# Patient Record
Sex: Female | Born: 1970
Health system: Southern US, Community
[De-identification: ages and names within clinical notes are randomized; demographics above are authoritative.]

## PROBLEM LIST (undated history)

## (undated) ENCOUNTER — Ambulatory Visit (HOSPITAL_COMMUNITY)

## (undated) DIAGNOSIS — D6859 Other primary thrombophilia: Secondary | ICD-10-CM

## (undated) DIAGNOSIS — Z86718 Personal history of other venous thrombosis and embolism: Secondary | ICD-10-CM

## (undated) DIAGNOSIS — I82409 Acute embolism and thrombosis of unspecified deep veins of unspecified lower extremity: Secondary | ICD-10-CM

## (undated) DIAGNOSIS — I10 Essential (primary) hypertension: Secondary | ICD-10-CM

## (undated) HISTORY — DX: Acute embolism and thrombosis of unspecified deep veins of unspecified lower extremity: I82.409

## (undated) HISTORY — DX: Other primary thrombophilia: D68.59

## (undated) HISTORY — DX: Personal history of other venous thrombosis and embolism: Z86.718

## (undated) HISTORY — PX: CHOLECYSTECTOMY: SHX55

## (undated) HISTORY — DX: Essential (primary) hypertension: I10

---

## 1997-11-15 ENCOUNTER — Encounter: Admission: RE | Admit: 1997-11-15 | Discharge: 1997-11-15 | Payer: Self-pay | Admitting: Family Medicine

## 1997-11-15 ENCOUNTER — Other Ambulatory Visit: Admission: RE | Admit: 1997-11-15 | Discharge: 1997-11-15 | Payer: Self-pay | Admitting: *Deleted

## 1999-02-01 ENCOUNTER — Encounter: Admission: RE | Admit: 1999-02-01 | Discharge: 1999-02-01 | Payer: Self-pay | Admitting: Family Medicine

## 1999-02-24 ENCOUNTER — Encounter: Admission: RE | Admit: 1999-02-24 | Discharge: 1999-02-24 | Payer: Self-pay | Admitting: Family Medicine

## 1999-06-23 ENCOUNTER — Other Ambulatory Visit: Admission: RE | Admit: 1999-06-23 | Discharge: 1999-06-23 | Payer: Self-pay | Admitting: Obstetrics and Gynecology

## 1999-11-24 ENCOUNTER — Ambulatory Visit (HOSPITAL_COMMUNITY): Admission: RE | Admit: 1999-11-24 | Discharge: 1999-11-24 | Payer: Self-pay | Admitting: Obstetrics and Gynecology

## 1999-11-24 ENCOUNTER — Other Ambulatory Visit: Admission: RE | Admit: 1999-11-24 | Discharge: 1999-11-24 | Payer: Self-pay | Admitting: Obstetrics and Gynecology

## 1999-12-01 ENCOUNTER — Encounter: Payer: Self-pay | Admitting: Obstetrics and Gynecology

## 1999-12-01 ENCOUNTER — Ambulatory Visit (HOSPITAL_COMMUNITY): Admission: RE | Admit: 1999-12-01 | Discharge: 1999-12-01 | Payer: Self-pay | Admitting: Obstetrics and Gynecology

## 2000-01-09 ENCOUNTER — Encounter: Admission: RE | Admit: 2000-01-09 | Discharge: 2000-04-08 | Payer: Self-pay | Admitting: Obstetrics and Gynecology

## 2000-02-17 ENCOUNTER — Inpatient Hospital Stay (HOSPITAL_COMMUNITY): Admission: AD | Admit: 2000-02-17 | Discharge: 2000-02-21 | Payer: Self-pay | Admitting: *Deleted

## 2000-04-04 ENCOUNTER — Other Ambulatory Visit: Admission: RE | Admit: 2000-04-04 | Discharge: 2000-04-04 | Payer: Self-pay | Admitting: Obstetrics and Gynecology

## 2000-06-04 ENCOUNTER — Ambulatory Visit (HOSPITAL_COMMUNITY): Admission: RE | Admit: 2000-06-04 | Discharge: 2000-06-04 | Payer: Self-pay | Admitting: *Deleted

## 2000-06-04 ENCOUNTER — Encounter (INDEPENDENT_AMBULATORY_CARE_PROVIDER_SITE_OTHER): Payer: Self-pay | Admitting: *Deleted

## 2000-06-04 ENCOUNTER — Encounter: Payer: Self-pay | Admitting: *Deleted

## 2000-06-04 ENCOUNTER — Ambulatory Visit (HOSPITAL_COMMUNITY): Admission: RE | Admit: 2000-06-04 | Discharge: 2000-06-05 | Payer: Self-pay | Admitting: General Surgery

## 2001-09-23 ENCOUNTER — Emergency Department (HOSPITAL_COMMUNITY): Admission: EM | Admit: 2001-09-23 | Discharge: 2001-09-23 | Payer: Self-pay | Admitting: Emergency Medicine

## 2003-03-27 HISTORY — PX: VEIN SURGERY: SHX48

## 2004-07-24 ENCOUNTER — Encounter: Admission: RE | Admit: 2004-07-24 | Discharge: 2004-07-24 | Payer: Self-pay | Admitting: Vascular Surgery

## 2010-06-12 ENCOUNTER — Other Ambulatory Visit: Payer: Self-pay

## 2010-06-12 ENCOUNTER — Other Ambulatory Visit: Payer: Self-pay | Admitting: Family Medicine

## 2010-08-11 NOTE — Op Note (Signed)
[facility]  Patient:    Cheryl Patel, Cheryl Patel                       MRN: [id number] Proc. Date: 02/18/00 Adm. Date:  [id number] Attending:  Marina Gravel B                           Operative Report  PREOPERATIVE DIAGNOSIS:       Breech presentation.  POSTOPERATIVE DIAGNOSIS:      Back down transverse lie.  OPERATION:                    Repeat low transverse cesarean section.  SURGEON:                      Marina Gravel, M.D.  ASSISTANT:                    Conni Elliot, M.D.  ANESTHESIA:                   Spinal.  FINDINGS:                     A viable female infant, back down transverse lie, Apgars 5 and 9.  Normal uterus, tubes, ovary.  COMPLICATIONS:                None.  DRAINS:                       Foley.  ESTIMATED BLOOD LOSS:         1000 cc.  INDICATIONS:                  This patient presented with spontaneous rupture of membranes at 37-5/7 weeks with a history of a previous C-section in [place].  She had subsequently had a successful VBAC in [place] and had been instructed that she was a VBAC candidate.  On admission, cervix was 2 cm dilated and vertex with a high presenting part.  Vertex presentation was confirmed by ultrasound.  Therefore, the patient was induced with Pitocin.  The patient progressed to 4 cm and small fetal parts were palpable at the cervical os.  A breech presentation was determined by ultrasound.  Therefore, we proceed with repeat low transverse cesarean section.  DESCRIPTION OF PROCEDURE:     The patient was taken to the operating room and spinal anesthesia obtained.  She was then prepped and draped in the standard fashion.  Foley catheter inserted into the bladder.  The knife was used to incise through the patients previous vertical midline skin incision.  This was carried sharply to the underlying fascia.  The fascia was divided sharply and elevated with the Kocher clamps.  The midline rectus muscles  were identified.  The underlying peritoneum was elevated with hemostats and entered sharply.  The incision was extended superiorly inferiorly sharply with good visualized of surrounding organs.  The bladder blade was inserted.  The vesicouterine peritoneum identified, elevated and bladder flap created with sharp and blunt technique.  Bladder blade was then reinserted and uterine incision made in a low transverse fashion with a knife and extended with bandage scissors.  Upon entry into the uterine cavity, a transverse lie was noted.  The spine was followed to the hips and lower extremity delivered through the incision by flexion of the hip and  traction on the foot.  The hips were then rotated sacrum anterior and the contralateral lower extremity delivered through the incision by flexion at the hips and knees.  Traction was then placed on the iliac crest to deliver the infant to the level of the scapulae.  The upper extremities were then delivered by flexion of the elbows.  Finally the head was delivered by flexion at the neck.  Nose and mouth suctioned with the bulb.  The cord was clamped and cut and the infant handed to the awaiting attendants.  The patient had been receiving penicillin for group B strep prophylaxis and therefore no additional antibiotics were given.  The placenta was removed manually and exteriorized and cleared of all clots and debris.  The uterine incision was then closed in two layers with 0 Monocryl.  A bleeder of the peritoneum on the left side was also reapproximated with 0 Vicryl.  Hemostasis was obtained.  Bladder flap hemostatic.  The uterus was reinserted into the abdomen and the pelvis irrigated.  The uterine incision was hemostatic.  The fascia was then closed with a running stitch of 0 PDS.  The subcutaneous tissue was irrigated and made hemostatic with a Bovie.  The skin was closed with staples.  All counts were correct per the operating room  staff.  The patient tolerated the procedure well and there were no complications. She was taken to the recovery room awake, alert and in stable condition.    DESCRIPTION OF PROCEDURE: DD:  02/18/00 TD:  02/18/00 Job: [id number]

## 2010-08-11 NOTE — Discharge Summary (Signed)
[facility]  Patient:    Cheryl Patel, Cheryl Patel                       MRN: [id number] Adm. Date:  [id number] Disc. Date: [id number] Attending:  Marina Gravel B                           Discharge Summary  ADMISSION DIAGNOSES:          Term intrauterine pregnancy, spontaneous rupture of membranes, vertex presentation.  DISCHARGE DIAGNOSES:          Term intrauterine pregnancy, delivered, spontaneous version to breech.  PROCEDURE:                    Repeat low transverse cesarean section.  HISTORY OF PRESENT ILLNESS:   For complete details please see the history and physical in the chart.  However, in brief, the patient is a 40 year old [place] female gravida 3, para 1-1-0-2 at 68 5/7 weeks who presented with spontaneous rupture of membranes on the day of admission.  She was having mild contractions, no bleeding.  Reported active fetus.  Prenatal care at [facility] with Dr. Estanislado Pandy uncomplicated except for late entry of prenatal care.                                Has a known history of previous cesarean section at 28 weeks for severe preeclampsia and cerebral bleed.  Successful VBAC followed that pregnancy at 40 weeks in [place].  She was told in [place] that it was okay for try for vaginal birth after cesarean.  Patient was admitted to labor and delivery for induction of labor with spontaneous rupture of membranes at term.  HOSPITAL COURSE:              On admission the cervix was 2 cm dilated, 50% effaced, high presenting station.  Vertex was confirmed at the bed side with ultrasound.  Patient was started on Pitocin given she was not having adequate contractions.  She progressed to 4 cm, complete effacement, high presenting part.  At that point small parts were palpable at the cervix.  Ultrasound was performed and confirmed would be a breech presentation.  Patient was therefore taken to the operating room for repeat low transverse cesarean section  for spontaneous version to breech.                                At the time of surgery patient was actually found to have a back down transverse lie and was delivered by repeat low transverse cesarean section.  A viable female infant Apgars 5 and 9 was delivered without difficulty.  Birth weight was 7 pounds 6 ounces.  There were no complications from the surgery.                                Postoperatively patient rapidly regained her ability to ambulate, void, and tolerate a regular diet.  She was discharged home on the third postoperative day in satisfactory condition.  Her postoperative hemoglobin was 11.4.  DISCHARGE MEDICATIONS:        Tylox one to two tablets q.4-6h. as needed for pain.  DISCHARGE INSTRUCTIONS:       Standard  preprinted discharge booklet given prior to dismissal.  FOLLOW-UP:                    Four to six weeks [facility]. DD:  03/14/00 TD:  03/14/00 Job: [id number]

## 2010-08-11 NOTE — Op Note (Signed)
[place]  Patient:    Cheryl Patel, Cheryl Patel                       MRN: [id number] Proc. Date: 06/04/00 Adm. Date:  [id number] Attending:  Glenna Fellows Tappan                           Operative Report  PREOPERATIVE DIAGNOSIS:  Chololithiasis and acute cholecystitis.  POSTOPERATIVE DIAGNOSIS:  Chololithiasis and acute cholecystitis.  PROCEDURE:  Laparoscopic cholecystectomy.  SURGEON:  Lorne Skeens. Hoxworth, M.D.  ASSISTANT:  Milus Mallick, M.D.  ANESTHESIA:  General.  BRIEF HISTORY:  Cheryl Patel is a 40 year old female who presents with 2-3 days of severe epigastric and right upper quadrant abdominal pain.  Ultrasound is showing a thickened gallbladder wall with stones.  She is markedly tender in the right upper quadrant.  White count is elevated to 17,000.  She is felt to have cholecystitis and chololithiasis, and laparoscopic cholecystectomy has been recommended and accepted.  The nature of procedures, indications, risks of bleeding, infection and bile duct injury have been discussed and understood.  She is now brought to the operating room for this procedure.  DESCRIPTION OF PROCEDURE:  The patient was brought to the operating room, placed in the supine position on the operating table and general endotracheal anesthesia was induced.  She received broad-spectrum antibiotic preoperatively.  The abdomen was sterilely prepped and draped.  PSA hose were in place.  Local anesthesia was used at the trocar sites prior to the incisions.  A 1 cm incision was made at the umbilicus and dissection carried down to the midline fascia which was sharply incised for 1 cm and the peritoneum entered under direct vision.  Through a mattress suture of 0 Vicryl the Hasson trocar was placed and pneumoperitoneum established.  Under direct vision a 10 mm trocar was placed in the subxiphoid area and two 5 mm trocars along the right subcostal margin.  The  gallbladder was visualized.  It was distended tensely and edematous.  It was decompressed with the needle aspirator.  The fundus was then grasped and elevated up over the liver. Omental adhesions were taken down bluntly with cautery and the infundibulum exposed and retracted inferolaterally.  The gallbladder wall was very edematous.  fibrofatty tissue was stripped off the neck of the gallbladder toward the porta hepatis.  The distal gallbladder was defined and seemed to tape down to the cystic duct.  The cystic duct was dissected over about 1 cm and the cystic duct gallbladder junction dissected 360 degrees.  Calots triangle was completely dissected and the cystic artery exposed coursing up along with the gallbladder wall.  When the anatomy was cleared the cystic duct was doubly clipped proximally, clipped distally and divided as was the cystic artery.  The gallbladder was then dissected free from its bed using hook cautery.  It was edematous but the dissection progressed nicely and the gallbladder was detached and placed in an endocatch bag and brought out through the umbilicus.  The right upper quadrant was irrigated and complete hemostasis assured in the gallbladder bed.  Trocars were removed under direct vision and all CO2 was evacuated from the peritoneal cavity.  The pursestring suture was secured at the umbilicus.  Skin incisions were closed with interrupted subcuticular 4-0 Monocryl and Steri-Strips.  Sponge, needle and instrument counts were correct.  Dry sterile dressing was applied and the patient was  taken to recovery in good condition. DD:  06/05/00 TD:  06/05/00 Job: [id number]

## 2012-03-24 ENCOUNTER — Other Ambulatory Visit (HOSPITAL_COMMUNITY): Payer: Self-pay | Admitting: Cardiovascular Disease

## 2012-03-24 DIAGNOSIS — R011 Cardiac murmur, unspecified: Secondary | ICD-10-CM

## 2012-03-24 DIAGNOSIS — I272 Pulmonary hypertension, unspecified: Secondary | ICD-10-CM

## 2012-03-31 ENCOUNTER — Ambulatory Visit (HOSPITAL_COMMUNITY)
Admission: RE | Admit: 2012-03-31 | Discharge: 2012-03-31 | Disposition: A | Discharge status: Home / Self Care | Payer: BC Managed Care – PPO | Source: Ambulatory Visit | Attending: Cardiovascular Disease | Admitting: Cardiovascular Disease

## 2012-03-31 DIAGNOSIS — I059 Rheumatic mitral valve disease, unspecified: Secondary | ICD-10-CM | POA: Insufficient documentation

## 2012-03-31 DIAGNOSIS — R079 Chest pain, unspecified: Secondary | ICD-10-CM | POA: Insufficient documentation

## 2012-03-31 DIAGNOSIS — I369 Nonrheumatic tricuspid valve disorder, unspecified: Secondary | ICD-10-CM | POA: Insufficient documentation

## 2012-03-31 DIAGNOSIS — I272 Pulmonary hypertension, unspecified: Secondary | ICD-10-CM

## 2012-03-31 DIAGNOSIS — R011 Cardiac murmur, unspecified: Secondary | ICD-10-CM | POA: Insufficient documentation

## 2012-03-31 HISTORY — PX: TRANSTHORACIC ECHOCARDIOGRAM: SHX275

## 2012-03-31 NOTE — Progress Notes (Signed)
2D Echo Performed 03/31/2012    Mackey Varricchio, RCS  

## 2013-01-21 ENCOUNTER — Encounter: Payer: Self-pay | Admitting: *Deleted

## 2013-01-23 ENCOUNTER — Ambulatory Visit: Payer: BC Managed Care – PPO | Admitting: Cardiovascular Disease

## 2013-01-27 ENCOUNTER — Encounter: Payer: Self-pay | Admitting: Cardiovascular Disease

## 2013-01-28 ENCOUNTER — Encounter: Payer: Self-pay | Admitting: Cardiovascular Disease

## 2013-01-28 ENCOUNTER — Ambulatory Visit (INDEPENDENT_AMBULATORY_CARE_PROVIDER_SITE_OTHER): Payer: BC Managed Care – PPO | Admitting: Cardiovascular Disease

## 2013-01-28 VITALS — BP 144/102 | HR 90 | Ht 68.0 in | Wt 192.2 lb

## 2013-01-28 DIAGNOSIS — I10 Essential (primary) hypertension: Secondary | ICD-10-CM

## 2013-01-28 DIAGNOSIS — Z86718 Personal history of other venous thrombosis and embolism: Secondary | ICD-10-CM | POA: Insufficient documentation

## 2013-01-28 MED ORDER — RIVAROXABAN 20 MG PO TABS: 20.0000 mg | ORAL_TABLET | Freq: Every day | ORAL | Status: DC

## 2013-01-28 NOTE — Progress Notes (Signed)
     01/28/2013 Cheryl Patel   [birth year]  [id number]  Primary Physician No primary provider on file. Primary Cardiologist: Runell Gess MD Roseanne Reno   HPI:  Cheryl Patel is a 42 year old moderately overweight married Caucasian female with mother 3 currently does not work. She is formally a patient of Dr. Alanda Amass There is a history of remote DVT on this alto. She has no other medical problems. She does have chronic left lower gemmae swelling and venous Dopplers performed at our office a year ago did not show evidence of DVT. She also has high blood pressure which she has documented on multiple occasions at home associated with headaches.   Current Outpatient Prescriptions  Medication Sig Dispense Refill  . Acetaminophen (TYLENOL PO) Take by mouth as needed.      . hydroxychloroquine (PLAQUENIL) 200 MG tablet Take 200 mg by mouth 2 (two) times daily.      . Rivaroxaban (XARELTO) 20 MG TABS tablet Take 20 mg by mouth daily.       No current facility-administered medications for this visit.    No Known Allergies  History   Social History  . Marital Status: Single    Spouse Name: N/A    Number of Children: 3  . Years of Education: N/A   Occupational History  . Not on file.   Social History Main Topics  . Smoking status: Never Smoker   . Smokeless tobacco: Never Used  . Alcohol Use: No  . Drug Use: No  . Sexual Activity: Not on file   Other Topics Concern  . Not on file   Social History Narrative   From [place]     Review of Systems: General: negative for chills, fever, night sweats or weight changes.  Cardiovascular: negative for chest pain, dyspnea on exertion, edema, orthopnea, palpitations, paroxysmal nocturnal dyspnea or shortness of breath Dermatological: negative for rash Respiratory: negative for cough or wheezing Urologic: negative for hematuria Abdominal: negative for nausea, vomiting, diarrhea, bright red blood per rectum, melena, or  hematemesis Neurologic: negative for visual changes, syncope, or dizziness All other systems reviewed and are otherwise negative except as noted above.    Blood pressure 144/102, pulse 90, height 5\' 8"  (1.727 m), weight 192 lb 3.2 oz (87.181 kg).  General appearance: alert and no distress Neck: no adenopathy, no carotid bruit, no JVD, supple, symmetrical, trachea midline and thyroid not enlarged, symmetric, no tenderness/mass/nodules Lungs: clear to auscultation bilaterally Heart: regular rate and rhythm, S1, S2 normal, no murmur, click, rub or gallop Extremities: left lower extremity swelling  EKG sinus rhythm and 90 without ST or T wave changes  ASSESSMENT AND PLAN:   No problem-specific assessment & plan notes found for this encounter.      Runell Gess MD FACP,FACC,FAHA, [facility] 01/28/2013 5:06 PM

## 2013-01-28 NOTE — Assessment & Plan Note (Signed)
Her blood pressure is elevated today. She does check it at home as well. I've asked her to keep a blood pressure log regular basis and will have her see Belenda Cruise back in one month to review her readings and make further recommendations with regards to antihypertensive medications. An ACE inhibitor may be a good first line drug.

## 2013-01-28 NOTE — Patient Instructions (Addendum)
Your physician wants you to follow-up in: 1 year with Dr Allyson Sabal. You will receive a reminder letter in the mail two months in advance. If you don't receive a letter, please call our office to schedule the follow-up appointment.  You will see our pharmacist, Belenda Cruise, in one month to check your blood pressure.

## 2013-01-28 NOTE — Assessment & Plan Note (Signed)
On Xarelto to anticoagulation with negative venous Doppler studies one year ago. She does have asymmetry in her legs left greater than right lower extremity size.

## 2013-02-05 ENCOUNTER — Other Ambulatory Visit: Payer: Self-pay | Admitting: Obstetrics and Gynecology

## 2013-02-05 DIAGNOSIS — Z1231 Encounter for screening mammogram for malignant neoplasm of breast: Secondary | ICD-10-CM

## 2013-02-25 ENCOUNTER — Ambulatory Visit (INDEPENDENT_AMBULATORY_CARE_PROVIDER_SITE_OTHER): Payer: BC Managed Care – PPO | Admitting: Pharmacist Clinician (PhC)/ Clinical Pharmacy Specialist

## 2013-02-25 ENCOUNTER — Encounter: Payer: Self-pay | Admitting: Pharmacist Clinician (PhC)/ Clinical Pharmacy Specialist

## 2013-02-25 VITALS — BP 138/94 | HR 84

## 2013-02-25 DIAGNOSIS — I10 Essential (primary) hypertension: Secondary | ICD-10-CM

## 2013-02-25 MED ORDER — LISINOPRIL 10 MG PO TABS
10.0000 mg | ORAL_TABLET | Freq: Every day | ORAL | Status: DC
Start: 2013-02-25 — End: 2013-11-23

## 2013-02-25 NOTE — Progress Notes (Signed)
     02/25/2013 Ellason Wannamaker 1971/01/10 [id number]   HPI:  Chrishauna Pakula is a 42 y.o. female patient of Dr Allyson Sabal, with a PMH below who presents today for a blood pressure check.  She was previously a patient of Dr. Alanda Amass, treated for history of DVT.  Her current medication list consists of Xarelto 20mg  daily and hydroxychloroquine 200mg  bid.  When she saw Dr. Allyson Sabal for the first time her BP was 144/102.  English is not her native language, although she understands without problem, sometimes she has trouble finding the right words.  She does not smoke or drink alcohol, and does not add salt to her food when cooking, as her husband has been treated for hypertension for several years and she tries to keep their diet low in sodium.  She does not do any regular exercise, although she states she likes to walk.  She also suffers from headaches, for which she uses tylenol, stating that they almost always start between 3-4 am, waking her from sleep.  They do have a home BP cuff, and in checking her pressures at home find her systolic readings 130-140's and the diastolic readings 85-100, predominantly in the mid 90s.     Current Outpatient Prescriptions  Medication Sig Dispense Refill  . Acetaminophen (TYLENOL PO) Take by mouth as needed.      . hydroxychloroquine (PLAQUENIL) 200 MG tablet Take 200 mg by mouth 2 (two) times daily.      Marland Kitchen lisinopril (PRINIVIL,ZESTRIL) 10 MG tablet Take 1 tablet (10 mg total) by mouth daily.  30 tablet  5  . Rivaroxaban (XARELTO) 20 MG TABS tablet Take 1 tablet (20 mg total) by mouth daily.  30 tablet  11   No current facility-administered medications for this visit.    No Known Allergies  Past Medical History  Diagnosis Date  . History of DVT (deep vein thrombosis)     LLE - xarelto  . Hypertension     Blood pressure 138/94, pulse 84.   ASSESSMENT AND PLAN:  Ms. Arnett has an elevated diastolic reading, which seems to run consistently in the 90s.  She  does not have any drug allergies and states, when asked, that she is beyond having children.  She understands the interaction of this ACE inhibitors with pregnancy.  Therefore we will start her on lisinopril 10mg  daily and will bring her back in 1 month to see how she is doing.  She will go the the lab in 10-14 days for a BMET so we can monitor kidney function and potassium levels.  She will continue to take her BP at home daily and bring her readings, along with her BP cuff to the next appointment.    Phillips Hay PharmD CPP [facility]

## 2013-02-25 NOTE — Patient Instructions (Signed)
Return for blood pressure check in 1 month  Go to the lab in about 10-14 days and have your blood drawn  Your blood pressure today is slightly elevated at 138/94  Check your blood pressure at home daily (if able) and keep record of the readings.  Start lisinopril 10mg  once daily (ok to take at same time as Xarelto)  Bring all of your meds, your BP cuff and your record of home blood pressures to your next appointment.  Exercise as you're able, try to walk approximately 30 minutes per day.  Keep salt intake to a minimum, especially watch canned and prepared boxed foods.  Eat more fresh fruits and vegetables and fewer canned items.  Avoid eating in fast food restaurants.    HOW TO TAKE YOUR BLOOD PRESSURE:   Rest 5 minutes before taking your blood pressure.    Don't smoke or drink caffeinated beverages for at least 30 minutes before.   Take your blood pressure before (not after) you eat.   Sit comfortably with your back supported and both feet on the floor (don't cross your legs).   Elevate your arm to heart level on a table or a desk.   Use the proper sized cuff. It should fit smoothly and snugly around your bare upper arm. There should be enough room to slip a fingertip under the cuff. The bottom edge of the cuff should be 1 inch above the crease of the elbow.   Ideally, take 3 measurements at one sitting and record the average.

## 2013-03-03 ENCOUNTER — Ambulatory Visit
Admission: RE | Admit: 2013-03-03 | Discharge: 2013-03-03 | Disposition: A | Discharge status: Home / Self Care | Payer: BC Managed Care – PPO | Source: Ambulatory Visit | Attending: Obstetrics and Gynecology | Admitting: Obstetrics and Gynecology

## 2013-03-03 DIAGNOSIS — Z1231 Encounter for screening mammogram for malignant neoplasm of breast: Secondary | ICD-10-CM

## 2013-03-10 LAB — BASIC METABOLIC PANEL
BUN: 13 mg/dL (ref 6–23)
Calcium: 9.6 mg/dL (ref 8.4–10.5)
Glucose, Bld: 84 mg/dL (ref 70–99)
Potassium: 4.2 mEq/L (ref 3.5–5.3)

## 2013-03-12 ENCOUNTER — Encounter: Payer: Self-pay | Admitting: Pharmacist Clinician (PhC)/ Clinical Pharmacy Specialist

## 2013-04-01 ENCOUNTER — Encounter: Payer: Self-pay | Admitting: Pharmacist Clinician (PhC)/ Clinical Pharmacy Specialist

## 2013-04-01 ENCOUNTER — Ambulatory Visit (INDEPENDENT_AMBULATORY_CARE_PROVIDER_SITE_OTHER): Payer: BC Managed Care – PPO | Admitting: Pharmacist Clinician (PhC)/ Clinical Pharmacy Specialist

## 2013-04-01 VITALS — BP 118/80 | HR 72

## 2013-04-01 DIAGNOSIS — I10 Essential (primary) hypertension: Secondary | ICD-10-CM

## 2013-04-01 NOTE — Progress Notes (Signed)
     04/01/2013 Tylee Burges [birth year] [id number]   HPI:  De BurrsZarfa Reppucci is a 43 y.o. female patient of Dr Allyson SabalBerry, with a PMH below who presents today for a blood pressure check.  I saw her approximately 1 month ago and her pressure was still elevated at 138/94.  She was started on lisinopril 10mg  and had a BMET drawn about 2 weeks later.  Labs were WNL.  She reports no problems with the medication.   English is not her native language, although she understands without problem, sometimes she has trouble finding the right words. She does not smoke or drink alcohol, and does not add salt to her food when cooking, as her husband has been treated for hypertension for several years and she tries to keep their diet low in sodium. She does not do any regular exercise, although she states she likes to walk.  She was put on a prednisone taper on Dec 23, starting at 20mg /d and decreasing by 5mg /week for 5 weeks.  This does not appear to have affected her BP, as her home readings have been consistently been 110-120s/70-80s, with the highest reading being 125/90.    Current Outpatient Prescriptions  Medication Sig Dispense Refill  . predniSONE (DELTASONE) 5 MG tablet Take 5 mg by mouth daily with breakfast. Taper from 4 tabs/day each week until gone      . Acetaminophen (TYLENOL PO) Take by mouth as needed.      . hydroxychloroquine (PLAQUENIL) 200 MG tablet Take 200 mg by mouth 2 (two) times daily.      Marland Kitchen. lisinopril (PRINIVIL,ZESTRIL) 10 MG tablet Take 1 tablet (10 mg total) by mouth daily.  30 tablet  5  . Rivaroxaban (XARELTO) 20 MG TABS tablet Take 1 tablet (20 mg total) by mouth daily.  30 tablet  11   No current facility-administered medications for this visit.    No Known Allergies  Past Medical History  Diagnosis Date  . History of DVT (deep vein thrombosis)     LLE - xarelto  . Hypertension     Blood pressure 118/80, pulse 72.  Standing pressure 108/80    ASSESSMENT AND PLAN:  Pt seems  to be doing very well on the lisinopril 10mg  dose.  She reports no orthostasis and her pressures are very well controlled.  I have asked her to continue with home BP checks 2-3 times per week and to call the office if she sees the results trending to >140/90.  In the meantime I have encouraged her to do more regular exercise and continue avoiding salt as ways to promote a healthy lifestyle.     Phillips HayKristin Wilder Kurowski PharmD CPP [facility]

## 2013-04-01 NOTE — Patient Instructions (Addendum)
Your blood pressure today is good at 118/80 Check your blood pressure at home 2-3 times per week and keep record of the readings.  Take your BP meds as follows: lisinopril 10mg   Bring all of your meds, your BP cuff and your record of home blood pressures to your next appointment.  Exercise as you're able, try to walk approximately 30 minutes per day.  Keep salt intake to a minimum, especially watch canned and prepared boxed foods.  Eat more fresh fruits and vegetables and fewer canned items.  Avoid eating in fast food restaurants.    HOW TO TAKE YOUR BLOOD PRESSURE:   Rest 5 minutes before taking your blood pressure.    Don't smoke or drink caffeinated beverages for at least 30 minutes before.   Take your blood pressure before (not after) you eat.   Sit comfortably with your back supported and both feet on the floor (don't cross your legs).   Elevate your arm to heart level on a table or a desk.   Use the proper sized cuff. It should fit smoothly and snugly around your bare upper arm. There should be enough room to slip a fingertip under the cuff. The bottom edge of the cuff should be 1 inch above the crease of the elbow.   Ideally, take 3 measurements at one sitting and record the average.

## 2013-11-23 ENCOUNTER — Other Ambulatory Visit: Payer: Self-pay | Admitting: *Deleted

## 2013-11-23 DIAGNOSIS — I10 Essential (primary) hypertension: Secondary | ICD-10-CM

## 2013-11-23 MED ORDER — LISINOPRIL 10 MG PO TABS: 10.0000 mg | ORAL_TABLET | Freq: Every day | ORAL | Status: DC

## 2013-11-23 NOTE — Telephone Encounter (Signed)
Rx was sent to pharmacy electronically. 

## 2014-02-26 ENCOUNTER — Other Ambulatory Visit: Payer: Self-pay | Admitting: Cardiovascular Disease

## 2014-03-24 ENCOUNTER — Other Ambulatory Visit: Payer: Self-pay | Admitting: Cardiovascular Disease

## 2014-03-27 ENCOUNTER — Other Ambulatory Visit: Payer: Self-pay | Admitting: Cardiovascular Disease

## 2014-03-30 ENCOUNTER — Other Ambulatory Visit: Payer: Self-pay | Admitting: Cardiovascular Disease

## 2014-03-30 MED ORDER — RIVAROXABAN 20 MG PO TABS: ORAL_TABLET | ORAL | Status: DC

## 2014-03-30 NOTE — Addendum Note (Signed)
Addended by: Rosalee KaufmanALVSTAD, KRISTIN L. on: 03/30/2014 02:12 PM   Modules accepted: Orders

## 2014-04-01 ENCOUNTER — Ambulatory Visit (INDEPENDENT_AMBULATORY_CARE_PROVIDER_SITE_OTHER): Payer: Self-pay | Admitting: Cardiovascular Disease

## 2014-04-01 ENCOUNTER — Encounter: Payer: Self-pay | Admitting: Cardiovascular Disease

## 2014-04-01 VITALS — BP 130/90 | HR 80 | Ht 67.0 in | Wt 177.0 lb

## 2014-04-01 DIAGNOSIS — Z86718 Personal history of other venous thrombosis and embolism: Secondary | ICD-10-CM

## 2014-04-01 DIAGNOSIS — I10 Essential (primary) hypertension: Secondary | ICD-10-CM

## 2014-04-01 DIAGNOSIS — R609 Edema, unspecified: Secondary | ICD-10-CM

## 2014-04-01 NOTE — Assessment & Plan Note (Signed)
History of hypertension with blood pressure measured at 130/90. She is on lisinopril 10 mg a day. Continue current meds at current dosing.

## 2014-04-01 NOTE — Progress Notes (Signed)
04/01/2014 Cheryl Patel   [birth year]  [id number]  Primary Physician No PCP Per Patient Primary Cardiologist: Runell GessJonathan J. Kamarie Veno MD Roseanne RenoFACP,FACC,FAHA, FSCAI   HPI:   [name] is a 44 year old moderately overweight married Caucasian female with mother 3 currently does not work. She is formally a patient of [name] she is accompanied by her son today. I last saw her one year ago.There is a history of remote DVT  25 years ago. 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 She does have a thrombophilia workup remarkable for a positive lupus anticoagulant. She also has a history of left common iliac vein stenting at [facility] presumably for [name] syndrome. Her other problems include treated hypertension.. She does have chronic left lower extremity swelling and venous Dopplers performed at our office 11/16/11 did not show evidence of DVT.    Current Outpatient Prescriptions  Medication Sig Dispense Refill  . Acetaminophen (TYLENOL PO) Take by mouth as needed.    . D3-50 50000 UNITS capsule Take 50,000 Units by mouth 2 (two) times a week.   0  . hydroxychloroquine (PLAQUENIL) 200 MG tablet Take 200 mg by mouth 2 (two) times daily.    Marland Kitchen. lisinopril (PRINIVIL,ZESTRIL) 10 MG tablet Take 1 tablet (10 mg total) by mouth daily. 30 tablet 2  . XARELTO 20 MG TABS tablet TAKE 1 TABLET BY MOUTH DAILY WITH LARGEST MEAL, NEED APPOINTMENT FOR FURTHER REFILLS 30 tablet 0   No current facility-administered medications for this visit.    No Known Allergies  History   Social History  . Marital Status: Married    Spouse Name: N/A    Number of Children: 3  . Years of Education: N/A   Occupational History  . Not on file.   Social History Main Topics  . Smoking status: Never Smoker   . Smokeless tobacco: Never Used  . Alcohol Use: No  . Drug Use: No  . Sexual Activity: Not on file   Other Topics Concern  . Not on file   Social History Narrative   From [place]     Review of  Systems: General: negative for chills, fever, night sweats or weight changes.  Cardiovascular: negative for chest pain, dyspnea on exertion, edema, orthopnea, palpitations, paroxysmal nocturnal dyspnea or shortness of breath Dermatological: negative for rash Respiratory: negative for cough or wheezing Urologic: negative for hematuria Abdominal: negative for nausea, vomiting, diarrhea, bright red blood per rectum, melena, or hematemesis Neurologic: negative for visual changes, syncope, or dizziness All other systems reviewed and are otherwise negative except as noted above.    Blood pressure 130/90, pulse 80, height 5\' 7"  (1.702 m), weight 177 lb (80.287 kg).  General appearance: alert and no distress Neck: no adenopathy, no carotid bruit, no JVD, supple, symmetrical, trachea midline and thyroid not enlarged, symmetric, no tenderness/mass/nodules Lungs: clear to auscultation bilaterally Heart: regular rate and rhythm, S1, S2 normal, no murmur, click, rub or gallop Extremities: asymmetry in her lower extremities with swelling of her left calf but no specific edema. She has 2+ pedal pulses bilaterally  EKG normal sinus rhythm at 80 without ST or T-wave changes. I personally reviewed this EKG  ASSESSMENT AND PLAN:   History of DVT (deep vein thrombosis) History of recurrent DVTs in the past. She had a DVT after her first childbirth 25 years ago. She has had [facility] ili[facility] presumably for May Thurner syndrome. She is also had a thrombophilia workup  with positive lupus anticoagulant, negative factor V mutation, negative factor V Leiden and no prothrombin gene mutation. She was difficult to control and Coumadin and has been on Xarelto  since. She does complain of continued swelling of her left leg but her last venous Doppler study performed 11/16/11 showed no evidence of DVT.  Essential hypertension History of hypertension with blood pressure measured at  130/90. She is on lisinopril 10 mg a day. Continue current meds at current dosing.      Runell Gess MD FACP,FACC,FAHA, FSCAI 04/01/2014 2:00 PM

## 2014-04-01 NOTE — Assessment & Plan Note (Addendum)
History of recurrent DVTs in the past. She had a DVT after her first childbirth 25 years ago. She has had left common iliac vein [facility] remotely presumably for May Thurner syndrome. She is also had a thrombophilia workup with positive lupus anticoagulant, negative factor V mutation, negative factor V Leiden and no prothrombin gene mutation. She was difficult to control and Coumadin and has been on Xarelto  since. She does complain of continued swelling of her left leg but her last venous Doppler study performed 11/16/11 showed no evidence of DVT. I'm going to get a venous reflux study on her left leg to see whether or not she has treatable superficial reflux. Contusion her positive lupus anticoagulant, stented left common iliac vein and recurrent DVTs, I am going to keep her on Xarelto  indefinitely.

## 2014-04-01 NOTE — Patient Instructions (Signed)
  We will see you back in follow up in 1 year with Dr Allyson SabalBerry.   Dr Allyson SabalBerry has ordered: 1. lower venous duplex(venous reflux study on your left leg). This test is an ultrasound of the veins in the legs. It looks at venous blood flow that carries blood from the heart to the legs or arms. Allow one hour for a Lower Venous exam. There are no restrictions or special instructions.

## 2014-04-06 ENCOUNTER — Telehealth (HOSPITAL_COMMUNITY): Payer: Self-pay | Admitting: *Deleted

## 2014-04-07 ENCOUNTER — Ambulatory Visit (HOSPITAL_COMMUNITY): Payer: BLUE CROSS/BLUE SHIELD

## 2014-06-09 ENCOUNTER — Other Ambulatory Visit: Payer: Self-pay | Admitting: Cardiovascular Disease

## 2015-01-10 ENCOUNTER — Other Ambulatory Visit: Payer: Self-pay | Admitting: Cardiovascular Disease

## 2015-05-03 ENCOUNTER — Other Ambulatory Visit: Payer: Self-pay | Admitting: Cardiovascular Disease

## 2015-05-06 ENCOUNTER — Encounter: Payer: Self-pay | Admitting: Physician Assistant

## 2015-05-06 NOTE — Progress Notes (Signed)
This encounter was created in error - please disregard.

## 2015-05-09 ENCOUNTER — Encounter: Payer: BLUE CROSS/BLUE SHIELD | Admitting: Physician Assistant

## 2015-05-09 ENCOUNTER — Telehealth: Payer: Self-pay | Admitting: Cardiovascular Disease

## 2015-05-09 DIAGNOSIS — I10 Essential (primary) hypertension: Secondary | ICD-10-CM

## 2015-05-09 MED ORDER — LISINOPRIL 10 MG PO TABS: 10.0000 mg | ORAL_TABLET | Freq: Every day | ORAL | Status: DC

## 2015-05-09 NOTE — Telephone Encounter (Signed)
Refill sent. Called pt to inform, no answer - lmtcb.

## 2015-05-09 NOTE — Telephone Encounter (Signed)
Patient has appt 05-18-15 to see PA.  She thought her appt was today.  She is out of her  "High Blood Pressure" medcine. Doesn't know the name of it.  [facility].  Can she have enough to last until 05-18-15.

## 2015-05-18 ENCOUNTER — Encounter: Payer: Self-pay | Admitting: Physician Assistant

## 2015-05-18 ENCOUNTER — Ambulatory Visit (INDEPENDENT_AMBULATORY_CARE_PROVIDER_SITE_OTHER): Payer: BLUE CROSS/BLUE SHIELD | Admitting: Physician Assistant

## 2015-05-18 VITALS — BP 124/82 | HR 81 | Ht 67.0 in | Wt 176.0 lb

## 2015-05-18 DIAGNOSIS — Z86718 Personal history of other venous thrombosis and embolism: Secondary | ICD-10-CM | POA: Diagnosis not present

## 2015-05-18 DIAGNOSIS — R600 Localized edema: Secondary | ICD-10-CM | POA: Diagnosis not present

## 2015-05-18 DIAGNOSIS — I10 Essential (primary) hypertension: Secondary | ICD-10-CM

## 2015-05-18 MED ORDER — RIVAROXABAN 20 MG PO TABS: 20.0000 mg | ORAL_TABLET | Freq: Every day | ORAL | Status: DC

## 2015-05-18 MED ORDER — LISINOPRIL 10 MG PO TABS: 10.0000 mg | ORAL_TABLET | Freq: Every day | ORAL | Status: DC

## 2015-05-18 NOTE — Progress Notes (Signed)
Patient ID: Cheryl Patel, female   DOB: June 14, 1970, 45 y.o.   MRN: [id number]    Date:  05/18/2015   ID:  Cheryl Patel, DOB [birth year], MRN [id number]  PCP:  No PCP Per Patient  Primary Cardiologist:  Allyson Sabal   Chief Complaint  Patient presents with  . Follow-up    no chest pain, some swelling, some pain and itching, no shortness of breath, no dizziness or lightheadedness     History of Present Illness: Adayah Hughlett is a 45 y.o. female mildly overweight married Caucasian female with mother 3 currently does not work. She is formally a patient of Dr. Alanda Amass.   There is a history of remote DVT 25 years ago. 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 She does have a thrombophilia workup remarkable for a positive lupus anticoagulant. She also has a history of left common iliac vein stenting at [facility] presumably for [name] syndrome. Her other problems include treated hypertension.. She does have chronic left lower extremity swelling and venous Dopplers performed at our office 11/16/11 did not show evidence of DVT.    She presents for one-year evaluation. Last year Dr. Allyson Sabal had ordered venous reflux study in her left leg in for one reason or another, that was not completed. She is here today basically because she needs refills on her blood pressure medications. There does not appear been any change in her left lower extremity is still swollen. She wears compression socks. She does get some pruritus and tenderness posteriorly and medially about mid leg down.  The patient currently denies nausea, vomiting, fever, chest pain, shortness of breath, orthopnea, dizziness, PND, cough, congestion, abdominal pain, hematochezia, melena, claudication.  Wt Readings from Last 3 Encounters:  05/18/15 176 lb (79.833 kg)  04/01/14 177 lb (80.287 kg)  01/28/13 192 lb 3.2 oz (87.181 kg)     Past Medical History  Diagnosis Date  . History of DVT (deep vein thrombosis)     LLE - xarelto, history of  left iliac vein stenting at [facility]  . Hypertension   . Hypercoagulable state (HCC)     positive lupus anticoagulant    Current Outpatient Prescriptions  Medication Sig Dispense Refill  . Acetaminophen (TYLENOL PO) Take by mouth as needed.    . D3-50 50000 UNITS capsule Take 50,000 Units by mouth 2 (two) times a week.   0  . hydroxychloroquine (PLAQUENIL) 200 MG tablet Take 200 mg by mouth 2 (two) times daily.    Marland Kitchen lisinopril (PRINIVIL,ZESTRIL) 10 MG tablet Take 1 tablet (10 mg total) by mouth daily. Keep appt. 30 tablet 6  . rivaroxaban (XARELTO) 20 MG TABS tablet Take 1 tablet (20 mg total) by mouth daily with supper. 30 tablet 6   No current facility-administered medications for this visit.    Allergies:   No Known Allergies  Social History:  The patient  reports that she has never smoked. She has never used smokeless tobacco. She reports that she does not drink alcohol or use illicit drugs.   Family history:   Family History  Problem Relation Age of Onset  . Pancreatic cancer Brother   . Hypertension Mother     ROS:  Please see the history of present illness.  All other systems reviewed and negative.   PHYSICAL EXAM: VS:  BP 124/82 mmHg  Pulse 81  Ht  (1.702 m)  Wt 176 lb (79.833 kg)  BMI 27.56 kg/m2  LMP 05/12/2015 Well nourished, well developed, in no  acute distress HEENT: Pupils are equal round react to light accommodation extraocular movements are intact.  Neck: no JVDNo cervical lymphadenopathy. Cardiac: Regular rate and rhythm without murmurs rubs or gallops. Lungs:  clear to auscultation bilaterally, no wheezing, rhonchi or rales Abd: soft, nontender, positive bowel sounds all quadrants, no hepatosplenomegaly Ext:  Chronic nonpitting left lower extremity edema.  There is  Mild darkening of her skin posteriorly and medial mid lower extremity which looks like venous stasis.  2+ radial and dorsalis pedis pulses. Skin: warm and dry.   She does have about a  quarter inch of yellowing on her left toenail of the great toe. Neuro:  Grossly normal  EKG:   Normal sinus rhythm rate 81 bpm  ASSESSMENT AND PLAN:  Problem List Items Addressed This Visit    History of DVT (deep vein thrombosis)   Essential hypertension - Primary   Relevant Medications   rivaroxaban (XARELTO) 20 MG TABS tablet   lisinopril (PRINIVIL,ZESTRIL) 10 MG tablet    Other Visit Diagnoses    Leg edema, left        Relevant Orders    VAS [place] LOWER EXTREMITY VENOUS REFLUX       She looks like she's got some discoloration on the left lower  extremity from venous stasis.   It is fairly faint.  I have reordered the lower extremity venous reflux study that was ordered by Dr. Allyson Sabal last year to get that scheduled.   Her blood pressure is controlled. We reordered her lisinopril 10 mg also refilled her Xarelto. She does wear compression socks.   Looks like she has fungal infection in 2 of her toenails.  I recommended she get some eucalyptus oil and apply it daily.  FU in 3 months.

## 2015-05-18 NOTE — Patient Instructions (Signed)
Your physician recommends that you continue on your current medications as directed. Please refer to the Current Medication list given to you today.  Your physician has requested that you have a lower extremity venous duplex/left leg venous reflux. This test is an ultrasound of the veins in the legs. It looks at venous blood flow that carries blood from the heart to the legs. Allow one hour for a Lower Venous exam. There are no restrictions or special instructions.  Your physician recommends that you schedule a follow-up appointment in: 3 months with Dr. Allyson Sabal

## 2015-05-27 ENCOUNTER — Telehealth: Payer: Self-pay | Admitting: Cardiovascular Disease

## 2015-05-27 NOTE — Telephone Encounter (Signed)
PATIENT WANTED AN  EARLIER APPOINTMENT TO DISCUSS ISSUE THAT WERE DISCUSSED PREVIOUSLY BUT NOT DOCUMENTED SCHEDULE FOR 07/01/15 1:45 PM

## 2015-05-27 NOTE — Telephone Encounter (Signed)
New message    Pt wants to speak to dr or rn about her filing for a disability claim

## 2015-05-27 NOTE — Telephone Encounter (Signed)
LEFT MESSAGE TO CALL BACK

## 2015-06-03 ENCOUNTER — Inpatient Hospital Stay (HOSPITAL_COMMUNITY): Admission: RE | Admit: 2015-06-03 | Payer: BLUE CROSS/BLUE SHIELD | Source: Ambulatory Visit

## 2015-07-01 ENCOUNTER — Ambulatory Visit (INDEPENDENT_AMBULATORY_CARE_PROVIDER_SITE_OTHER): Payer: BLUE CROSS/BLUE SHIELD | Admitting: Cardiovascular Disease

## 2015-07-01 ENCOUNTER — Encounter: Payer: Self-pay | Admitting: Cardiovascular Disease

## 2015-07-01 VITALS — BP 142/82 | HR 60 | Ht 68.0 in | Wt 176.0 lb

## 2015-07-01 DIAGNOSIS — I10 Essential (primary) hypertension: Secondary | ICD-10-CM

## 2015-07-01 DIAGNOSIS — Z86718 Personal history of other venous thrombosis and embolism: Secondary | ICD-10-CM

## 2015-07-01 NOTE — Assessment & Plan Note (Signed)
History of remote DVT 25 years ago and recurrent DVT since that time. She's had a thrombophilia workup and apparently has lupus anticoagulant currently on oral anticoagulation. There is also history of left common iliac artery stenting at [facility] presumably for May Thurner  Syndrome. Her last venous Doppler study performed in 2013 showed no evidence of residual thrombus. She has chronic swelling and probably has venous insufficiency. She does wear compression stockings. There is nothing further to do at this time.

## 2015-07-01 NOTE — Assessment & Plan Note (Signed)
History of hypertension blood pressure measured at 142/82. She is on lisinopril. Continue current meds at current dosing

## 2015-07-01 NOTE — Progress Notes (Signed)
07/01/2015 Cheryl Patel   1970-05-06  [id number]  Primary Physician No PCP Per Patient Primary Cardiologist: Runell GessJonathan J. Jamekia Gannett MD Roseanne RenoFACP,FACC,FAHA, FSCAI   HPI:  [name] is a 45 year old moderately overweight married Caucasian female with mother 3 currently does not work. She is formally a patient of [name] she is accompanied by her daughter today. I last saw her 04/01/14..There is a history of remote DVT 25 years ago. 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 She does have a thrombophilia workup remarkable for a positive lupus anticoagulant. She also has a history of [facility] presumably for Cheryl Patel syndrome. Her other problems include treated hypertension.. She does have chronic left lower extremity swelling and venous Dopplers performed at our office 11/16/11 did not show evidence of DVT.   Current Outpatient Prescriptions  Medication Sig Dispense Refill  . Acetaminophen (TYLENOL PO) Take by mouth as needed.    . D3-50 50000 UNITS capsule Take 50,000 Units by mouth 2 (two) times a week.   0  . hydroxychloroquine (PLAQUENIL) 200 MG tablet Take 200 mg by mouth 2 (two) times daily.    Marland Kitchen. lisinopril (PRINIVIL,ZESTRIL) 10 MG tablet Take 1 tablet (10 mg total) by mouth daily. Keep appt. 30 tablet 6  . rivaroxaban (XARELTO) 20 MG TABS tablet Take 1 tablet (20 mg total) by mouth daily with supper. 30 tablet 6   No current facility-administered medications for this visit.    No Known Allergies  Social History   Social History  . Marital Status: Married    Spouse Name: N/A  . Number of Children: 3  . Years of Education: N/A   Occupational History  . Not on file.   Social History Main Topics  . Smoking status: Never Smoker   . Smokeless tobacco: Never Used  . Alcohol Use: No  . Drug Use: No  . Sexual Activity: Not on file   Other Topics Concern  . Not on file   Social History Narrative   From [place]     Review of  Systems: General: negative for chills, fever, night sweats or weight changes.  Cardiovascular: negative for chest pain, dyspnea on exertion, edema, orthopnea, palpitations, paroxysmal nocturnal dyspnea or shortness of breath Dermatological: negative for rash Respiratory: negative for cough or wheezing Urologic: negative for hematuria Abdominal: negative for nausea, vomiting, diarrhea, bright red blood per rectum, melena, or hematemesis Neurologic: negative for visual changes, syncope, or dizziness All other systems reviewed and are otherwise negative except as noted above.    Blood pressure 142/82, pulse 60, height 5\' 8"  (1.727 m), weight 176 lb (79.833 kg).  General appearance: alert and no distress Neck: no adenopathy, no carotid bruit, no JVD, supple, symmetrical, trachea midline and thyroid not enlarged, symmetric, no tenderness/mass/nodules Lungs: clear to auscultation bilaterally Heart: regular rate and rhythm, S1, S2 normal, no murmur, click, rub or gallop Extremities: extremities normal, atraumatic, no cyanosis or edema  EKG not performed today  ASSESSMENT AND PLAN:   History of DVT (deep vein thrombosis) History of remote DVT 25 years ago and recurrent DVT since that time. She's had a thrombophilia workup and apparently has lupus anticoagulant currently on oral anticoagulation. There is also history of [facility] presumably for May Patel  Syndrome. Her last venous Doppler study performed in 2013 showed no evidence of residual thrombus. She has chronic swelling and probably has venous insufficiency. She does wear compression  stockings. There is nothing further to do at this time.  Essential hypertension History of hypertension blood pressure measured at 142/82. She is on lisinopril. Continue current meds at current dosing      Runell Gess MD [facility], [facility] 07/01/2015 2:04 PM

## 2015-07-01 NOTE — Patient Instructions (Signed)
Medication Instructions:  Your physician recommends that you continue on your current medications as directed. Please refer to the Current Medication list given to you today.   Labwork: none  Testing/Procedures: none  Follow-Up: Follow up with Dr. Berry as needed.   Any Other Special Instructions Will Be Listed Below (If Applicable).     If you need a refill on your cardiac medications before your next appointment, please call your pharmacy.   

## 2015-08-31 ENCOUNTER — Encounter: Payer: Self-pay | Admitting: *Deleted

## 2015-08-31 ENCOUNTER — Ambulatory Visit: Payer: BLUE CROSS/BLUE SHIELD | Admitting: Cardiovascular Disease

## 2015-11-29 DIAGNOSIS — M545 Low back pain: Secondary | ICD-10-CM | POA: Diagnosis not present

## 2016-03-28 DIAGNOSIS — H04123 Dry eye syndrome of bilateral lacrimal glands: Secondary | ICD-10-CM | POA: Diagnosis not present

## 2016-04-04 DIAGNOSIS — R29898 Other symptoms and signs involving the musculoskeletal system: Secondary | ICD-10-CM | POA: Diagnosis not present

## 2016-04-04 DIAGNOSIS — M0589 Other rheumatoid arthritis with rheumatoid factor of multiple sites: Secondary | ICD-10-CM | POA: Diagnosis not present

## 2016-04-04 DIAGNOSIS — M7989 Other specified soft tissue disorders: Secondary | ICD-10-CM | POA: Diagnosis not present

## 2016-05-01 DIAGNOSIS — M545 Low back pain: Secondary | ICD-10-CM | POA: Diagnosis not present

## 2016-05-23 ENCOUNTER — Other Ambulatory Visit: Payer: Self-pay

## 2016-05-23 DIAGNOSIS — I10 Essential (primary) hypertension: Secondary | ICD-10-CM

## 2016-05-23 MED ORDER — LISINOPRIL 10 MG PO TABS: 10.0000 mg | ORAL_TABLET | Freq: Every day | ORAL | 3 refills | Status: DC

## 2016-05-24 ENCOUNTER — Other Ambulatory Visit: Payer: Self-pay | Admitting: Pharmacist

## 2016-05-24 NOTE — Telephone Encounter (Signed)
[facility]  Need undated BMET & CBC before Xarelto refill.   Last available blood work is from 2014. Last cardiologist visit was 11 months ago and no f/u appointment scheduled yet.  LMOM for patient to call back

## 2016-05-25 ENCOUNTER — Other Ambulatory Visit: Payer: Self-pay

## 2016-05-29 ENCOUNTER — Other Ambulatory Visit: Payer: Self-pay | Admitting: Cardiovascular Disease

## 2016-05-29 MED ORDER — RIVAROXABAN 20 MG PO TABS: 20.0000 mg | ORAL_TABLET | Freq: Every day | ORAL | 6 refills | Status: DC

## 2016-05-29 NOTE — Telephone Encounter (Signed)
°*  STAT* If patient is at the pharmacy, call can be transferred to refill team.   1. Which medications need to be refilled? (please list name of each medication and dose if known) Xarelto-pt has an appointment with Dr Allyson SabalBerry on 06-12-16-she is out of her medicine-need them asap 2. Which pharmacy/location (including street and city if local pharmacy) is medication to be sent to[facility] [address],[place]  3. Do they need a 30 day or 90 day supply? 30 *

## 2016-05-29 NOTE — Telephone Encounter (Signed)
LMOM; Patient to call back with more information if taking medication. Need f/ublood work and cardiologist appointment prior to refill authorization.

## 2016-05-29 NOTE — Telephone Encounter (Signed)
Follow up   Pt went to pharmacy twice and still no medication refill for xarelto   Please fill prescription pt do not have any more

## 2016-05-29 NOTE — Telephone Encounter (Signed)
Rx refilled, discussed w patient and pharmacy, aware this is available for pickup. Further refills, 30 v 90 day supply to be discussed at visit.

## 2016-06-12 ENCOUNTER — Encounter: Payer: Self-pay | Admitting: Cardiovascular Disease

## 2016-06-12 ENCOUNTER — Ambulatory Visit (INDEPENDENT_AMBULATORY_CARE_PROVIDER_SITE_OTHER): Payer: BLUE CROSS/BLUE SHIELD | Admitting: Cardiovascular Disease

## 2016-06-12 VITALS — BP 125/86 | HR 82 | Ht 68.0 in | Wt 214.4 lb

## 2016-06-12 DIAGNOSIS — Z86718 Personal history of other venous thrombosis and embolism: Secondary | ICD-10-CM

## 2016-06-12 DIAGNOSIS — I10 Essential (primary) hypertension: Secondary | ICD-10-CM

## 2016-06-12 MED ORDER — RIVAROXABAN 20 MG PO TABS: 20.0000 mg | ORAL_TABLET | Freq: Every day | ORAL | 6 refills | Status: DC

## 2016-06-12 NOTE — Addendum Note (Signed)
Addended by: Evans LanceSTOVER, Jaquavion Mccannon W on: 06/12/2016 10:30 AM   Modules accepted: Orders

## 2016-06-12 NOTE — Patient Instructions (Signed)
Medication Instructions: Your physician recommends that you continue on your current medications as directed. Please refer to the Current Medication list given to you today.   Labwork: Your physician recommends that you return for lab work.   Follow-Up: Your physician wants you to follow-up in: 1 year with an extender. You will receive a reminder letter in the mail two months in advance. If you don't receive a letter, please call our office to schedule the follow-up appointment.  If you need a refill on your cardiac medications before your next appointment, please call your pharmacy.

## 2016-06-12 NOTE — Progress Notes (Signed)
06/12/2016 Cheryl Patel   09/05/1970  [id number]  Primary Physician No PCP Per Patient Primary Cardiologist: Runell GessJonathan J Paralee Pendergrass MD Roseanne RenoFACP, FACC, FAHA, FSCAI  HPI:  [name] is a 46 year old moderately overweight married Caucasian female with mother 3 currently does not work. She is formally a patient of [name] she is accompanied by her daughter today. I last saw her 04/01/14..There is a history of remote DVT 25 years ago. 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 She does have a thrombophilia workup remarkable for a positive lupus anticoagulant. She also has a history of [facility] common [facility] presumably for [name] syndrome. Her other problems include treated hypertension.. She does have chronic left lower extremity swelling and venous Dopplers performed at our office 11/16/11 did not show evidence of DVT. Since I saw her a year ago she remains completely stable with some mild chronic swelling of her left lower extremity.   Current Outpatient Prescriptions  Medication Sig Dispense Refill  . Acetaminophen (TYLENOL PO) Take by mouth as needed.    . D3-50 50000 UNITS capsule Take 50,000 Units by mouth 2 (two) times a week.   0  . hydroxychloroquine (PLAQUENIL) 200 MG tablet Take 200 mg by mouth 2 (two) times daily.    Marland Kitchen. lisinopril (PRINIVIL,ZESTRIL) 10 MG tablet Take 1 tablet (10 mg total) by mouth daily. Keep appt. 30 tablet 3  . rivaroxaban (XARELTO) 20 MG TABS tablet Take 1 tablet (20 mg total) by mouth daily with supper. 30 tablet 6   No current facility-administered medications for this visit.     No Known Allergies  Social History   Social History  . Marital status: Married    Spouse name: N/A  . Number of children: 3  . Years of education: N/A   Occupational History  . Not on file.   Social History Main Topics  . Smoking status: Never Smoker  . Smokeless tobacco: Never Used  . Alcohol use No  . Drug use: No  . Sexual activity: Not  on file   Other Topics Concern  . Not on file   Social History Narrative   From [place]     Review of Systems: General: negative for chills, fever, night sweats or weight changes.  Cardiovascular: negative for chest pain, dyspnea on exertion, edema, orthopnea, palpitations, paroxysmal nocturnal dyspnea or shortness of breath Dermatological: negative for rash Respiratory: negative for cough or wheezing Urologic: negative for hematuria Abdominal: negative for nausea, vomiting, diarrhea, bright red blood per rectum, melena, or hematemesis Neurologic: negative for visual changes, syncope, or dizziness All other systems reviewed and are otherwise negative except as noted above.    Blood pressure 125/86, pulse 82, height 5\' 8"  (1.727 m), weight 214 lb 6.4 oz (97.3 kg), SpO2 100 %.  General appearance: alert and no distress Neck: no adenopathy, no carotid bruit, no JVD, supple, symmetrical, trachea midline and thyroid not enlarged, symmetric, no tenderness/mass/nodules Lungs: clear to auscultation bilaterally Heart: regular rate and rhythm, S1, S2 normal, no murmur, click, rub or gallop Extremities: extremities normal, atraumatic, no cyanosis or edema  EKG not performed today  ASSESSMENT AND PLAN:   Essential hypertension History of hypertension blood pressure measures 125/86. She is on lisinopril. Continue current meds at current dosing  History of DVT (deep vein thrombosis) History of prior DVT on Xarelto  oral anticoagulation. She has had her [facility] presumably because of May Thurner  syndrome.      Runell Gess MD FACP,FACC,FAHA, [facility] 06/12/2016 10:23 AM

## 2016-06-12 NOTE — Assessment & Plan Note (Signed)
History of hypertension blood pressure measures 125/86. She is on lisinopril. Continue current meds at current dosing

## 2016-06-12 NOTE — Assessment & Plan Note (Addendum)
History of prior DVT on continued Xarelto  oral anticoagulation because of documented thrombophilia and history of lupus anticoagulant.. She has had her [facility] presumably because of May Thurner syndrome.

## 2016-06-14 LAB — LIPID PANEL
CHOL/HDL RATIO: 2.9 ratio (ref <5.0)
CHOLESTEROL: 184 mg/dL (ref <200)
HDL: 63 mg/dL (ref >50)
LDL Cholesterol: 99 mg/dL (ref <100)
TRIGLYCERIDES: 108 mg/dL (ref <150)
VLDL: 22 mg/dL (ref <30)

## 2016-06-14 LAB — BASIC METABOLIC PANEL WITH GFR
BUN: 14 mg/dL (ref 7–25)
CHLORIDE: 103 mmol/L (ref 98–110)
CO2: 23 mmol/L (ref 20–31)
CREATININE: 0.78 mg/dL (ref 0.50–1.10)
Calcium: 9.5 mg/dL (ref 8.6–10.2)
GFR, Est African American: >89 mL/min (ref >=60)
GFR, Est Non African American: >89 mL/min (ref >=60)
Glucose, Bld: 87 mg/dL (ref 65–99)
Potassium: 4.4 mmol/L (ref 3.5–5.3)
Sodium: 138 mmol/L (ref 135–146)

## 2016-06-14 LAB — CBC WITH DIFFERENTIAL/PLATELET
Basophils Absolute: 0 cells/uL (ref 0–200)
Basophils Relative: 0 %
Eosinophils Absolute: 59 cells/uL (ref 15–500)
Eosinophils Relative: 1 %
HEMATOCRIT: 45.7 % — AB (ref 35.0–45.0)
Hemoglobin: 15.5 g/dL (ref 11.7–15.5)
LYMPHS PCT: 26 %
Lymphs Abs: 1534 cells/uL (ref 850–3900)
MCH: 31.1 pg (ref 27.0–33.0)
MCHC: 33.9 g/dL (ref 32.0–36.0)
MCV: 91.6 fL (ref 80.0–100.0)
MONO ABS: 354 {cells}/uL (ref 200–950)
MPV: 9.4 fL (ref 7.5–12.5)
Monocytes Relative: 6 %
NEUTROS PCT: 67 %
Neutro Abs: 3953 cells/uL (ref 1500–7800)
Platelets: 289 10*3/uL (ref 140–400)
RBC: 4.99 MIL/uL (ref 3.80–5.10)
RDW: 13.4 % (ref 11.0–15.0)
WBC: 5.9 10*3/uL (ref 3.8–10.8)

## 2016-06-14 LAB — TSH: TSH: 0.35 mIU/L — ABNORMAL LOW

## 2016-06-14 LAB — HEPATIC FUNCTION PANEL
ALT: 18 U/L (ref 6–29)
AST: 22 U/L (ref 10–35)
Albumin: 4.3 g/dL (ref 3.6–5.1)
Alkaline Phosphatase: 48 U/L (ref 33–115)
BILIRUBIN DIRECT: 0.1 mg/dL (ref <=0.2)
Indirect Bilirubin: 0.5 mg/dL (ref 0.2–1.2)
TOTAL PROTEIN: 7.3 g/dL (ref 6.1–8.1)
Total Bilirubin: 0.6 mg/dL (ref 0.2–1.2)

## 2016-06-14 LAB — HEMOGLOBIN A1C
Hgb A1c MFr Bld: 4.8 % (ref <5.7)
Mean Plasma Glucose: 91 mg/dL

## 2016-06-14 LAB — PSA: PSA: <0.1 ng/mL

## 2016-06-14 LAB — T4, FREE: FREE T4: 1.2 ng/dL (ref 0.8–1.8)

## 2016-06-27 ENCOUNTER — Telehealth: Payer: Self-pay | Admitting: Cardiovascular Disease

## 2016-06-27 NOTE — Telephone Encounter (Signed)
Spoke w patient, aware of results including normals, and low TSH - which Dr. Allyson Sabal advised needs PCP f/u. Pt sees Theatre stage manager at [address] location. Aware I will fax lab findings to their office. She voiced thanks and states all concerns were addressed.

## 2016-06-27 NOTE — Telephone Encounter (Signed)
New message ° ° ° °Pt is calling to find out about lab results.  °

## 2016-06-27 NOTE — Telephone Encounter (Signed)
Labs faxed to   Jarrett Soho PA-C (patient's stated PCP) At [facility] at [facility].  Routed as [employer]

## 2016-06-27 NOTE — Telephone Encounter (Signed)
Follow up     Pt is returning [place] call about lab results

## 2016-06-27 NOTE — Telephone Encounter (Signed)
Left msg to call. Results previously given by Ladona Ridgel, CMA

## 2016-07-05 DIAGNOSIS — R946 Abnormal results of thyroid function studies: Secondary | ICD-10-CM | POA: Diagnosis not present

## 2016-07-05 DIAGNOSIS — L601 Onycholysis: Secondary | ICD-10-CM | POA: Diagnosis not present

## 2016-07-16 DIAGNOSIS — B373 Candidiasis of vulva and vagina: Secondary | ICD-10-CM | POA: Diagnosis not present

## 2016-07-16 DIAGNOSIS — N907 Vulvar cyst: Secondary | ICD-10-CM | POA: Diagnosis not present

## 2016-07-26 ENCOUNTER — Ambulatory Visit: Payer: BLUE CROSS/BLUE SHIELD | Admitting: Podiatry

## 2016-07-26 DIAGNOSIS — N907 Vulvar cyst: Secondary | ICD-10-CM | POA: Diagnosis not present

## 2016-08-02 DIAGNOSIS — M0589 Other rheumatoid arthritis with rheumatoid factor of multiple sites: Secondary | ICD-10-CM | POA: Diagnosis not present

## 2016-08-02 DIAGNOSIS — Z6832 Body mass index (BMI) 32.0-32.9, adult: Secondary | ICD-10-CM | POA: Diagnosis not present

## 2016-08-02 DIAGNOSIS — R29898 Other symptoms and signs involving the musculoskeletal system: Secondary | ICD-10-CM | POA: Diagnosis not present

## 2016-08-02 DIAGNOSIS — M7989 Other specified soft tissue disorders: Secondary | ICD-10-CM | POA: Diagnosis not present

## 2016-08-08 DIAGNOSIS — Z01419 Encounter for gynecological examination (general) (routine) without abnormal findings: Secondary | ICD-10-CM | POA: Diagnosis not present

## 2016-08-08 DIAGNOSIS — Z124 Encounter for screening for malignant neoplasm of cervix: Secondary | ICD-10-CM | POA: Diagnosis not present

## 2016-08-08 DIAGNOSIS — Z1231 Encounter for screening mammogram for malignant neoplasm of breast: Secondary | ICD-10-CM | POA: Diagnosis not present

## 2016-08-08 DIAGNOSIS — Z6833 Body mass index (BMI) 33.0-33.9, adult: Secondary | ICD-10-CM | POA: Diagnosis not present

## 2016-08-08 DIAGNOSIS — Z139 Encounter for screening, unspecified: Secondary | ICD-10-CM | POA: Diagnosis not present

## 2016-09-29 ENCOUNTER — Other Ambulatory Visit: Payer: Self-pay | Admitting: Cardiovascular Disease

## 2016-09-29 DIAGNOSIS — I10 Essential (primary) hypertension: Secondary | ICD-10-CM

## 2016-10-02 ENCOUNTER — Ambulatory Visit (INDEPENDENT_AMBULATORY_CARE_PROVIDER_SITE_OTHER): Payer: BLUE CROSS/BLUE SHIELD | Admitting: Podiatry

## 2016-10-02 ENCOUNTER — Encounter: Payer: Self-pay | Admitting: Podiatry

## 2016-10-02 DIAGNOSIS — L603 Nail dystrophy: Secondary | ICD-10-CM | POA: Diagnosis not present

## 2016-10-02 DIAGNOSIS — B351 Tinea unguium: Secondary | ICD-10-CM | POA: Diagnosis not present

## 2016-10-02 NOTE — Progress Notes (Signed)
   Subjective:    Patient ID: Cheryl Patel, female    DOB: 09-10-1970, 46 y.o.   MRN: [id number]  HPI  46 year old female presents the office if or concerns and discolored, thick toenails of the left side worse than the right. This been ongoing for about 2 years. She states that she believes that she took an oral medication a couple years ago for about 1 month did not notice much of a difference. She states the toenails have not been hurting she denies any redness or drainage but she states that she is concerned they are getting worse. She has no other concerns today.   Review of Systems  All other systems reviewed and are negative.      Objective:   Physical Exam General: AAO x3, NAD  Dermatological: Nails are very dystrophic, discolored, mildly hypertrophic the nails 1-5 on the left and wanted to on the right. There is no tenderness female there is no cellulitis or drainage. There is yellow-brown discoloration the nails with some mild white discoloration. No open lesions or pre-ulcer lesions identified today.   Vascular: Dorsalis Pedis artery and Posterior Tibial artery pedal pulses are 2/4 bilateral with immedate capillary fill time. PThere is no pain with calf compression, swelling, warmth, erythema.   Neruologic: Grossly intact via light touch bilateral. Vibratory intact via tuning fork bilateral. Protective threshold with Semmes Wienstein monofilament intact to all pedal sites bilateral.   Musculoskeletal: No gross boney pedal deformities bilateral. No pain, crepitus, or limitation noted with foot and ankle range of motion bilateral. Muscular strength 5/5 in all groups tested bilateral.  Gait: Unassisted, Nonantalgic.       Assessment & Plan:  46 year old female with onychodystrophy, likely onychomycosis -Treatment options discussed including all alternatives, risks, and complications -Etiology of symptoms were discussed -Nails are debrided today were [facility]  evaluation. Discussed treatment options however we will await the results of the nail culture/biopsy before proceeding with definitive treatment and she agrees to this plan. Her son accompanied her today to help translate and she understood.  Ovid CurdMatthew Wagoner, DPM

## 2016-10-23 ENCOUNTER — Encounter: Payer: Self-pay | Admitting: Podiatry

## 2016-10-23 ENCOUNTER — Ambulatory Visit (INDEPENDENT_AMBULATORY_CARE_PROVIDER_SITE_OTHER): Payer: BLUE CROSS/BLUE SHIELD | Admitting: Podiatry

## 2016-10-23 DIAGNOSIS — Z79899 Other long term (current) drug therapy: Secondary | ICD-10-CM

## 2016-10-23 DIAGNOSIS — B351 Tinea unguium: Secondary | ICD-10-CM | POA: Insufficient documentation

## 2016-10-23 MED ORDER — TERBINAFINE HCL 250 MG PO TABS: 250.0000 mg | ORAL_TABLET | Freq: Every day | ORAL | 0 refills | Status: DC

## 2016-10-23 NOTE — Progress Notes (Signed)
Subjective: Cheryl Patel presents the capsular discussed nail culture results. She states her nails are unchanged she has no new concerns today. Denies any pain in the nails and denies any surrounding redness or drainage. Denies any systemic complaints such as fevers, chills, nausea, vomiting. No acute changes since last appointment, and no other complaints at this time.   Objective: AAO x3, NAD DP/PT pulses palpable bilaterally, CRT less than 3 seconds Nails continue to be dystrophic, discolored and mildly hypertrophic in nails 1-5 in the left side. There is no pain of the nails there is no surrounding redness or drainage. Is no clinical signs of infection present.  No open lesions or pre-ulcerative lesions.  No pain with calf compression, swelling, warmth, erythema  Assessment: Onychomycosis  Plan: -All treatment options discussed with the patient including all alternatives, risks, complications.  Discussed nail culture results. After discussion discussed treatment options for this. At this point she was pursued oral therapy. Prescription for Lamisil provided and [facility] but we will also check blood work performed starting the medication. -Ordered CBC and LFTs. She is not to start the medication until I call her with the results of blood work. Discussed side effects the medication to watch out for. Follow-up in 4-6 weeks or sooner if needed. -Patient encouraged to call the office with any questions, concerns, change in symptoms.   Ovid CurdMatthew Wagoner, DPM

## 2016-10-23 NOTE — Patient Instructions (Signed)

## 2016-10-24 ENCOUNTER — Telehealth: Payer: Self-pay | Admitting: *Deleted

## 2016-10-24 LAB — CBC WITH DIFFERENTIAL/PLATELET
BASOS: 0 %
Basophils Absolute: 0 10*3/uL (ref 0.0–0.2)
EOS (ABSOLUTE): 0.2 10*3/uL (ref 0.0–0.4)
EOS: 3 %
HEMATOCRIT: 42 % (ref 34.0–46.6)
HEMOGLOBIN: 14.3 g/dL (ref 11.1–15.9)
IMMATURE GRANS (ABS): 0 10*3/uL (ref 0.0–0.1)
Immature Granulocytes: 0 %
LYMPHS ABS: 1.6 10*3/uL (ref 0.7–3.1)
LYMPHS: 31 %
MCH: 30.7 pg (ref 26.6–33.0)
MCHC: 34 g/dL (ref 31.5–35.7)
MCV: 90 fL (ref 79–97)
Monocytes Absolute: 0.4 10*3/uL (ref 0.1–0.9)
Monocytes: 8 %
NEUTROS ABS: 2.9 10*3/uL (ref 1.4–7.0)
Neutrophils: 58 %
Platelets: 290 10*3/uL (ref 150–379)
RBC: 4.66 x10E6/uL (ref 3.77–5.28)
RDW: 14.1 % (ref 12.3–15.4)
WBC: 4.9 10*3/uL (ref 3.4–10.8)

## 2016-10-24 LAB — HEPATIC FUNCTION PANEL
ALK PHOS: 42 IU/L (ref 39–117)
ALT: 14 IU/L (ref 0–32)
AST: 20 IU/L (ref 0–40)
Albumin: 4.5 g/dL (ref 3.5–5.5)
BILIRUBIN, DIRECT: 0.1 mg/dL (ref 0.00–0.40)
Bilirubin Total: 0.4 mg/dL (ref 0.0–1.2)
Total Protein: 7.1 g/dL (ref 6.0–8.5)

## 2016-10-24 NOTE — Telephone Encounter (Addendum)
-----   Message from Vivi BarrackMatthew R Wagoner, DPM sent at 10/24/2016 10:20 AM EDT ----- Labs normal- ok to start lamisil. Please let her know. Left message informing pt of Dr. Gabriel RungWagoner's review of labs and orders.

## 2016-11-01 DIAGNOSIS — E039 Hypothyroidism, unspecified: Secondary | ICD-10-CM | POA: Diagnosis not present

## 2016-11-05 DIAGNOSIS — R946 Abnormal results of thyroid function studies: Secondary | ICD-10-CM | POA: Diagnosis not present

## 2016-11-05 DIAGNOSIS — E559 Vitamin D deficiency, unspecified: Secondary | ICD-10-CM | POA: Diagnosis not present

## 2016-12-13 ENCOUNTER — Ambulatory Visit (INDEPENDENT_AMBULATORY_CARE_PROVIDER_SITE_OTHER): Payer: BLUE CROSS/BLUE SHIELD | Admitting: Endocrinology

## 2016-12-13 ENCOUNTER — Encounter: Payer: Self-pay | Admitting: Endocrinology

## 2016-12-13 VITALS — BP 142/86 | HR 79 | Temp 97.9°F | Ht 67.5 in | Wt 181.2 lb

## 2016-12-13 DIAGNOSIS — R7989 Other specified abnormal findings of blood chemistry: Secondary | ICD-10-CM

## 2016-12-13 DIAGNOSIS — R946 Abnormal results of thyroid function studies: Secondary | ICD-10-CM | POA: Diagnosis not present

## 2016-12-13 NOTE — Progress Notes (Signed)
Patient ID: Cheryl Patel, female   DOB: 31-Mar-1970, 46 y.o.   MRN: [id number]           Referring physician: Jarrett Soho, PA  Chief complaint: Tiredness  History of Present Illness:  It is unclear why patient was having evaluation of her thyroid She thinks that she has had symptoms of fatigue and tiredness for the last 6 months or so Last year she had gained weight but the last few months she has tried to exercise and cut back on her calorie intake and has lost 20-25 She does not think she has had many palpitations, shakiness, heat intolerance or change in bowel habits  She does not think she has had any thyroid problem before She has not expressed any discomfort or pressure in her neck and no history of goiter  Labs available currently from her PCP office show TSH of 0.3 in August but free T4 and free T3 are normal Free T4 0.88 and free T3 2.8 Previous TSH level not available   Past Medical History:  Diagnosis Date  . History of DVT (deep vein thrombosis)    LLE - xarelto, history of left iliac vein stenting at [facility]  . Hypercoagulable state (HCC)    positive lupus anticoagulant  . Hypertension     Past Surgical History:  Procedure Laterality Date  . CESAREAN SECTION  04/1999   x2  . CHOLECYSTECTOMY    . TRANSTHORACIC ECHOCARDIOGRAM  03/31/2012   EF 50-55%, normal systolucn function  . VEIN SURGERY  2005   stent in LE vein at [facility]    Family History  Problem Relation Age of Onset  . Hypertension Mother   . Pancreatic cancer Brother     Social History:  reports that she has never smoked. She has never used smokeless tobacco. She reports that she does not drink alcohol or use drugs.  Allergies: No Known Allergies  Allergies as of 12/13/2016   No Known Allergies     Medication List       Accurate as of 12/13/16 11:14 AM. Always use your most recent med list.          hydroxychloroquine 200 MG tablet Commonly known as:  PLAQUENIL Take 200  mg by mouth 2 (two) times daily.   lisinopril 10 MG tablet Commonly known as:  PRINIVIL,ZESTRIL TAKE 1 TABLET (10 MG TOTAL) BY MOUTH DAILY. KEEP APPT.   rivaroxaban 20 MG Tabs tablet Commonly known as:  XARELTO Take 1 tablet (20 mg total) by mouth daily with supper.   terbinafine 250 MG tablet Commonly known as:  LAMISIL Take 1 tablet (250 mg total) by mouth daily.       LABS:  No visits with results within 1 Week(s) from this visit.  Latest known visit with results is:  Office Visit on 10/23/2016  Component Date Value Ref Range Status  . WBC 10/23/2016 4.9  3.4 - 10.8 x10E3/uL Final  . RBC 10/23/2016 4.66  3.77 - 5.28 x10E6/uL Final  . Hemoglobin 10/23/2016 14.3  11.1 - 15.9 g/dL Final  . Hematocrit 78/29/5621 42.0  34.0 - 46.6 % Final  . MCV 10/23/2016 90  79 - 97 fL Final  . MCH 10/23/2016 30.7  26.6 - 33.0 pg Final  . MCHC 10/23/2016 34.0  31.5 - 35.7 g/dL Final  . RDW 30/86/5784 14.1  12.3 - 15.4 % Final  . Platelets 10/23/2016 290  150 - 379 x10E3/uL Final  . Neutrophils 10/23/2016 58  Not  Estab. % Final  . Lymphs 10/23/2016 31  Not Estab. % Final  . Monocytes 10/23/2016 8  Not Estab. % Final  . Eos 10/23/2016 3  Not Estab. % Final  . Basos 10/23/2016 0  Not Estab. % Final  . Neutrophils Absolute 10/23/2016 2.9  1.4 - 7.0 x10E3/uL Final  . Lymphocytes Absolute 10/23/2016 1.6  0.7 - 3.1 x10E3/uL Final  . Monocytes Absolute 10/23/2016 0.4  0.1 - 0.9 x10E3/uL Final  . EOS (ABSOLUTE) 10/23/2016 0.2  0.0 - 0.4 x10E3/uL Final  . Basophils Absolute 10/23/2016 0.0  0.0 - 0.2 x10E3/uL Final  . Immature Granulocytes 10/23/2016 0  Not Estab. % Final  . Immature Grans (Abs) 10/23/2016 0.0  0.0 - 0.1 x10E3/uL Final  . Total Protein 10/23/2016 7.1  6.0 - 8.5 g/dL Final  . Albumin 16/12/9602 4.5  3.5 - 5.5 g/dL Final  . Bilirubin Total 10/23/2016 0.4  0.0 - 1.2 mg/dL Final  . Bilirubin, Direct 10/23/2016 0.10  0.00 - 0.40 mg/dL Final  . Alkaline Phosphatase 10/23/2016 42  39 -  117 IU/L Final  . AST 10/23/2016 20  0 - 40 IU/L Final  . ALT 10/23/2016 14  0 - 32 IU/L Final        Review of Systems  Constitutional: Positive for weight loss and weight gain.  Respiratory: Negative for shortness of breath.   Cardiovascular: Positive for leg swelling. Negative for palpitations.  Gastrointestinal: Negative for diarrhea and abdominal pain.  Endocrine: Positive for fatigue.  Musculoskeletal: Negative for joint pain.  Skin: Negative for rash.  Neurological: Negative for tremors.  Psychiatric/Behavioral: Negative for nervousness.     PHYSICAL EXAM:  BP (!) 142/86 (BP Location: Left Arm, Patient Position: Sitting, Cuff Size: Normal)   Pulse 79   Temp 97.9 F (36.6 C) (Oral)   Ht 5' 7.5" (1.715 m)   Wt 181 lb 3.2 oz (82.2 kg)   SpO2 98%   BMI 27.96 kg/m   GENERAL:   Somewhat heavyset, minimal obesity No pallor, clubbing, cervical lymphadenopathy or edema.    Skin:  no rash or pigmentation.  EYES:  Externally normal.   ENT: Oral mucosa and tongue normal.  Neck: No prominence of the right carotid bulb but no tenderness  THYROID:  Not palpable.  HEART:  Normal  S1 and S2; no murmur or click.  CHEST:  Normal shape.  Lungs: Vescicular breath sounds heard equally.  No crepitations/ wheeze.  ABDOMEN:  No distention.  Liver and spleen not palpable.  No other mass or tenderness.  NEUROLOGICAL: .Reflexes are bilaterally normal at biceps  JOINTS:  Normal peripheral joints.  Extremities: She has swelling/ edema the entire left leg, mild pitting around the lower leg and ankle  ASSESSMENT:   Low normal TSH of 0.3 without increased free T4 and free T3 This is not indicative of hyperthyroidism No palpable thyroid enlargement Do not think that she has any significant thyroid disease and may have a mildly autonomous thyroid or this may be related to her weight changes over the last year as a secondary effect  Fatigue: Unclear etiology, not related to  minimal decrease in TSH level  Long-standing history of left-sided DVT and post phlebitis edema    PLAN:   No further evaluation needed for the thyroid at this time She have another set of thyroid levels checked by her PCP in 6 months and will be reevaluated if more significantly abnormal She will follow-up with her PCP for her fatigue  Consultation note sent to the referring physician  [facility] 12/13/2016, 11:14 AM

## 2017-01-08 DIAGNOSIS — T63481A Toxic effect of venom of other arthropod, accidental (unintentional), initial encounter: Secondary | ICD-10-CM | POA: Diagnosis not present

## 2017-01-21 ENCOUNTER — Telehealth: Payer: Self-pay | Admitting: Podiatry

## 2017-01-21 NOTE — Telephone Encounter (Signed)
Pt said shes not sure if you want to see her or if you can just give her a refill.  The pt has 5 pills left.

## 2017-01-21 NOTE — Telephone Encounter (Signed)
If she needs a refill she should come in. She has finished her 3 months but if the nail is not looking better please have her come in. It can take several months for the nail to continue to grow in.

## 2017-01-22 NOTE — Telephone Encounter (Signed)
I informed pt, she had been prescribed therapeutic dosing of 90, and it would take 6-9 months for her to see healthy out grow. Pt states she is seeing good nails and will call for an appt in 6 months.

## 2017-02-22 DIAGNOSIS — J0100 Acute maxillary sinusitis, unspecified: Secondary | ICD-10-CM | POA: Diagnosis not present

## 2017-02-22 DIAGNOSIS — H6123 Impacted cerumen, bilateral: Secondary | ICD-10-CM | POA: Diagnosis not present

## 2017-02-22 DIAGNOSIS — R05 Cough: Secondary | ICD-10-CM | POA: Diagnosis not present

## 2017-02-25 DIAGNOSIS — R29898 Other symptoms and signs involving the musculoskeletal system: Secondary | ICD-10-CM | POA: Diagnosis not present

## 2017-02-25 DIAGNOSIS — M5442 Lumbago with sciatica, left side: Secondary | ICD-10-CM | POA: Diagnosis not present

## 2017-02-25 DIAGNOSIS — M0589 Other rheumatoid arthritis with rheumatoid factor of multiple sites: Secondary | ICD-10-CM | POA: Diagnosis not present

## 2017-06-12 DIAGNOSIS — R1032 Left lower quadrant pain: Secondary | ICD-10-CM | POA: Diagnosis not present

## 2017-06-20 ENCOUNTER — Other Ambulatory Visit: Payer: Self-pay | Admitting: Cardiovascular Disease

## 2017-06-20 DIAGNOSIS — I10 Essential (primary) hypertension: Secondary | ICD-10-CM

## 2017-06-21 NOTE — Telephone Encounter (Signed)
REFILL 

## 2017-06-21 NOTE — Telephone Encounter (Signed)
F/u appointment on Monday  Will repeat BMET prior to refill xarelto

## 2017-06-24 ENCOUNTER — Ambulatory Visit: Payer: BLUE CROSS/BLUE SHIELD | Admitting: Cardiology

## 2017-06-24 ENCOUNTER — Encounter: Payer: Self-pay | Admitting: Cardiology

## 2017-06-24 DIAGNOSIS — R760 Raised antibody titer: Secondary | ICD-10-CM

## 2017-06-24 DIAGNOSIS — Z86718 Personal history of other venous thrombosis and embolism: Secondary | ICD-10-CM

## 2017-06-24 DIAGNOSIS — I10 Essential (primary) hypertension: Secondary | ICD-10-CM

## 2017-06-24 DIAGNOSIS — Z7901 Long term (current) use of anticoagulants: Secondary | ICD-10-CM | POA: Diagnosis not present

## 2017-06-24 MED ORDER — LISINOPRIL 10 MG PO TABS: 10.0000 mg | ORAL_TABLET | Freq: Every day | ORAL | 1 refills | Status: DC

## 2017-06-24 MED ORDER — RIVAROXABAN 20 MG PO TABS: 20.0000 mg | ORAL_TABLET | Freq: Every day | ORAL | 6 refills | Status: DC

## 2017-06-24 MED ORDER — HYDROCHLOROTHIAZIDE 12.5 MG PO CAPS: 12.5000 mg | ORAL_CAPSULE | ORAL | 0 refills | Status: DC

## 2017-06-24 NOTE — Patient Instructions (Signed)
Medication Instructions: Your physician recommends that you continue on your current medications as directed. Please refer to the Current Medication list given to you today.  START HCTZ (Hydrochlorothiazide) 12.5 mg three times weekly as needed.   Follow-Up: Your physician wants you to follow-up in: 1 year with Dr. Allyson SabalBerry. You will receive a reminder letter in the mail two months in advance. If you don't receive a letter, please call our office to schedule the follow-up appointment.  If you need a refill on your cardiac medications before your next appointment, please call your pharmacy.

## 2017-06-24 NOTE — Assessment & Plan Note (Signed)
+   LE anticoagulant per records

## 2017-06-24 NOTE — Assessment & Plan Note (Signed)
Controlled.  

## 2017-06-24 NOTE — Assessment & Plan Note (Signed)
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

## 2017-06-24 NOTE — Assessment & Plan Note (Signed)
Xarelto

## 2017-06-24 NOTE — Progress Notes (Signed)
06/24/2017 Cheryl Patel   1970-11-14  [id number]  Primary Physician Jarrett SohoWharton, Courtney, PA-C Primary Cardiologist: Dr Allyson SabalBerry  HPI:  47 year old female from [place],  formally a patient of [name] now followed by Dr Allyson SabalBerry, with a history of remote recurrent DVTs. She has had a thrombophilia workup remarkable for a positive lupus anticoagulant in the past. 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 She does have chronic left lower extremity swelling. She elevates her leg when possible and uses compression stocking. She had to quit her job at [employer], too much standing.  Venous Dopplers performed at our office 11/16/11 did not show evidence of DVT. She asked if there was someone we could refer her to about her leg swelling.    Current Outpatient Medications  Medication Sig Dispense Refill  . hydroxychloroquine (PLAQUENIL) 200 MG tablet Take 200 mg by mouth 2 (two) times daily.    Marland Kitchen. lisinopril (PRINIVIL,ZESTRIL) 10 MG tablet TAKE 1 TABLET BY MOUTH EVERY DAY 90 tablet 0  . rivaroxaban (XARELTO) 20 MG TABS tablet Take 1 tablet (20 mg total) by mouth daily with supper. 30 tablet 6  . VITAMIN D, ERGOCALCIFEROL, PO Take 1 tablet by mouth daily.     No current facility-administered medications for this visit.     No Known Allergies  Past Medical History:  Diagnosis Date  . History of DVT (deep vein thrombosis)    LLE - xarelto, history of left iliac [facility] at [facility]  . Hypercoagulable state (HCC)    positive lupus anticoagulant  . Hypertension     Social History   Socioeconomic History  . Marital status: Married    Spouse name: Not on file  . Number of children: 3  . Years of education: Not on file  . Highest education level: Not on file  Occupational History  . Not on file  Social Needs  . Financial resource strain: Not on file  . Food insecurity:    Worry: Not on file    Inability:  Not on file  . Transportation needs:    Medical: Not on file    Non-medical: Not on file  Tobacco Use  . Smoking status: Never Smoker  . Smokeless tobacco: Never Used  Substance and Sexual Activity  . Alcohol use: No  . Drug use: No  . Sexual activity: Not on file  Lifestyle  . Physical activity:    Days per week: Not on file    Minutes per session: Not on file  . Stress: Not on file  Relationships  . Social connections:    Talks on phone: Not on file    Gets together: Not on file    Attends religious service: Not on file    Active member of club or organization: Not on file    Attends meetings of clubs or organizations: Not on file    Relationship status: Not on file  . Intimate partner violence:    Fear of current or ex partner: Not on file    Emotionally abused: Not on file    Physically abused: Not on file    Forced sexual activity: Not on file  Other Topics Concern  . Not on file  Social History Narrative   From [place]     Family History  Problem Relation Age of Onset  . Hypertension Mother   . Pancreatic cancer Brother  Review of Systems: General: negative for chills, fever, night sweats or weight changes.  Cardiovascular: negative for chest pain, dyspnea on exertion, edema, orthopnea, palpitations, paroxysmal nocturnal dyspnea or shortness of breath Dermatological: negative for rash Respiratory: negative for cough or wheezing Urologic: negative for hematuria Abdominal: negative for nausea, vomiting, diarrhea, bright red blood per rectum, melena, or hematemesis Neurologic: negative for visual changes, syncope, or dizziness All other systems reviewed and are otherwise negative except as noted above.    Blood pressure 132/80, pulse 82, height 5' 7.5" (1.715 m), weight 189 lb (85.7 kg).  General appearance: alert, cooperative and no distress Neck: no carotid bruit and no JVD Lungs: clear to auscultation bilaterally Heart: regular rate and  rhythm Extremities: 1-2+ LLE edema to calf Skin: Skin color, texture, turgor normal. No rashes or lesions Neurologic: Grossly normal  EKG NSR  ASSESSMENT AND PLAN:   Essential hypertension Controlled  History of DVT (deep vein thrombosis) History of recurrent left lower extremity DVT. 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 She has been watching her calorie intake and walking and has lost 25 lbs in the last year. I also gave her an Rx for HCTZ 12.5 mg three times week PRN for edema. She is due to see her PCP later this week and I'll ask him to draw a BMP (she needs yearly BMP while on Xarelto).  I'll ask Dr Allyson Sabal if he feels a referral to vein and Vascular specialist would be helpful.  Corine Shelter PA-C 06/24/2017 3:03 PM

## 2017-06-27 DIAGNOSIS — Z9109 Other allergy status, other than to drugs and biological substances: Secondary | ICD-10-CM | POA: Diagnosis not present

## 2017-06-27 DIAGNOSIS — F5101 Primary insomnia: Secondary | ICD-10-CM | POA: Diagnosis not present

## 2017-06-27 DIAGNOSIS — Z0000 Encounter for general adult medical examination without abnormal findings: Secondary | ICD-10-CM | POA: Diagnosis not present

## 2017-06-27 DIAGNOSIS — F411 Generalized anxiety disorder: Secondary | ICD-10-CM | POA: Diagnosis not present

## 2017-06-27 DIAGNOSIS — E039 Hypothyroidism, unspecified: Secondary | ICD-10-CM | POA: Diagnosis not present

## 2017-07-01 ENCOUNTER — Telehealth: Payer: Self-pay | Admitting: Podiatry

## 2017-07-01 NOTE — Telephone Encounter (Signed)
If her toenails are doing better I would hold off on further medication but if she feels that she would like to go back on it, I would need to see her.

## 2017-07-01 NOTE — Telephone Encounter (Signed)
Pt would like refill of medication(working really well). [facility] [place].

## 2017-07-02 NOTE — Telephone Encounter (Signed)
I informed pt of Dr. Gabriel RungWagoner's recommendations. She states she may need to be seen all are doing good but 2 are dead. I transferred pt to schedulers.

## 2017-07-05 ENCOUNTER — Encounter: Payer: Self-pay | Admitting: Podiatry

## 2017-07-05 ENCOUNTER — Telehealth: Payer: Self-pay | Admitting: Podiatry

## 2017-07-05 ENCOUNTER — Ambulatory Visit: Payer: BLUE CROSS/BLUE SHIELD | Admitting: Podiatry

## 2017-07-05 DIAGNOSIS — B351 Tinea unguium: Secondary | ICD-10-CM | POA: Diagnosis not present

## 2017-07-05 NOTE — Patient Instructions (Signed)

## 2017-07-05 NOTE — Telephone Encounter (Signed)
Left message with Rinaldo CloudPamela at [facility] for Medical Records Department to fax over pt's most recent lab work for Dr. Ardelle AntonWagoner to review. Asked it to be faxed attention Shanda BumpsJessica S/Dr. Ardelle AntonWagoner and to the fax number of [phone]. Gave phone number of [phone] to call with any problems. Pt sees Meridee ScoreAndy Brake, [place]NP.

## 2017-07-08 ENCOUNTER — Telehealth: Payer: Self-pay | Admitting: *Deleted

## 2017-07-08 DIAGNOSIS — Z79899 Other long term (current) drug therapy: Secondary | ICD-10-CM

## 2017-07-08 MED ORDER — TERBINAFINE HCL 250 MG PO TABS: 250.0000 mg | ORAL_TABLET | Freq: Every day | ORAL | 0 refills | Status: DC

## 2017-07-08 NOTE — Telephone Encounter (Signed)
Pt called for results. I informed pt Dr. Ardelle AntonWagoner had reviewed her results and she could take the lamisil, and repeat the LFT and CBC in 6 weeks. Mailed copy of future labs to pt to have performed [facility] [phone].

## 2017-07-08 NOTE — Progress Notes (Signed)
Subjective: 47 year old female presents the office today with her husband for follow-up evaluation of nail fungus.  She states that she has noticed significant improvement while she was on the Lamisil in the left toenail is doing good however the right toenail is still having some problems and was doing much better when she is on the Lamisil but when she stopped it started to come back.  Denies any pain in the nails and denies any redness or drainage or any swelling.  She would proceed with more Lamisil if able. Denies any systemic complaints such as fevers, chills, nausea, vomiting. No acute changes since last appointment, and no other complaints at this time.   Objective: AAO x3, NAD DP/PT pulses palpable bilaterally, CRT less than 3 seconds Overall the nails although mildly hypertrophic, dystrophic, discolored with yellow-brown discoloration most notably her right hallux toenail is the most significant.  There is no clinical signs of infection present today.  No open lesions or pre-ulcerative lesions.  No pain with calf compression, swelling, warmth, erythema  Assessment: Onychomycosis  Plan: -All treatment options discussed with the patient including all alternatives, risks, complications.  -At this point we discussed another round of Lamisil.  She recently had blood work performed by her primary care physician I will try to get copies of this.  Once we confirm her blood work is within normal limits we will do another round of Lamisil discussed side effects of medications again today.  She understands this and wished to proceed. -Patient encouraged to call the office with any questions, concerns, change in symptoms.   Vivi BarrackMatthew R Wagoner DPM

## 2017-08-01 DIAGNOSIS — F330 Major depressive disorder, recurrent, mild: Secondary | ICD-10-CM | POA: Diagnosis not present

## 2017-08-01 DIAGNOSIS — F5101 Primary insomnia: Secondary | ICD-10-CM | POA: Diagnosis not present

## 2017-08-01 DIAGNOSIS — H6123 Impacted cerumen, bilateral: Secondary | ICD-10-CM | POA: Diagnosis not present

## 2017-08-01 DIAGNOSIS — F411 Generalized anxiety disorder: Secondary | ICD-10-CM | POA: Diagnosis not present

## 2017-08-01 DIAGNOSIS — B351 Tinea unguium: Secondary | ICD-10-CM | POA: Diagnosis not present

## 2017-08-01 DIAGNOSIS — Z6828 Body mass index (BMI) 28.0-28.9, adult: Secondary | ICD-10-CM | POA: Diagnosis not present

## 2017-08-12 DIAGNOSIS — Z6828 Body mass index (BMI) 28.0-28.9, adult: Secondary | ICD-10-CM | POA: Diagnosis not present

## 2017-08-12 DIAGNOSIS — Z124 Encounter for screening for malignant neoplasm of cervix: Secondary | ICD-10-CM | POA: Diagnosis not present

## 2017-08-12 DIAGNOSIS — Z13228 Encounter for screening for other metabolic disorders: Secondary | ICD-10-CM | POA: Diagnosis not present

## 2017-08-12 DIAGNOSIS — E559 Vitamin D deficiency, unspecified: Secondary | ICD-10-CM | POA: Diagnosis not present

## 2017-08-12 DIAGNOSIS — Z1231 Encounter for screening mammogram for malignant neoplasm of breast: Secondary | ICD-10-CM | POA: Diagnosis not present

## 2017-08-12 DIAGNOSIS — Z130 Encounter for screening for diseases of the blood and blood-forming organs and certain disorders involving the immune mechanism: Secondary | ICD-10-CM | POA: Diagnosis not present

## 2017-08-12 DIAGNOSIS — Z01419 Encounter for gynecological examination (general) (routine) without abnormal findings: Secondary | ICD-10-CM | POA: Diagnosis not present

## 2017-08-16 DIAGNOSIS — Z139 Encounter for screening, unspecified: Secondary | ICD-10-CM | POA: Diagnosis not present

## 2017-08-22 ENCOUNTER — Other Ambulatory Visit: Payer: Self-pay | Admitting: Cardiology

## 2017-08-26 ENCOUNTER — Other Ambulatory Visit: Payer: Self-pay | Admitting: Cardiology

## 2017-08-26 NOTE — Telephone Encounter (Signed)
Rx sent to pharmacy   

## 2017-09-09 DIAGNOSIS — R29898 Other symptoms and signs involving the musculoskeletal system: Secondary | ICD-10-CM | POA: Diagnosis not present

## 2017-09-09 DIAGNOSIS — M5442 Lumbago with sciatica, left side: Secondary | ICD-10-CM | POA: Diagnosis not present

## 2017-09-09 DIAGNOSIS — M0589 Other rheumatoid arthritis with rheumatoid factor of multiple sites: Secondary | ICD-10-CM | POA: Diagnosis not present

## 2017-11-16 ENCOUNTER — Other Ambulatory Visit: Payer: Self-pay | Admitting: Cardiovascular Disease

## 2017-11-20 ENCOUNTER — Other Ambulatory Visit: Payer: Self-pay | Admitting: Cardiology

## 2018-01-02 DIAGNOSIS — S80861A Insect bite (nonvenomous), right lower leg, initial encounter: Secondary | ICD-10-CM | POA: Diagnosis not present

## 2018-03-11 ENCOUNTER — Other Ambulatory Visit: Payer: Self-pay | Admitting: Cardiovascular Disease

## 2018-05-11 ENCOUNTER — Other Ambulatory Visit: Payer: Self-pay | Admitting: Cardiovascular Disease

## 2018-07-17 ENCOUNTER — Telehealth: Payer: Self-pay | Admitting: *Deleted

## 2018-07-17 NOTE — Telephone Encounter (Signed)
Left message for patient to call and schedule 6 mos recall with Dr. Allyson Sabal (virtual/telephone)

## 2018-07-20 ENCOUNTER — Other Ambulatory Visit: Payer: Self-pay | Admitting: Cardiology

## 2018-07-20 DIAGNOSIS — I10 Essential (primary) hypertension: Secondary | ICD-10-CM

## 2018-07-21 NOTE — Telephone Encounter (Signed)
Lisinopril 10 mg refilled  

## 2018-08-06 DIAGNOSIS — M5442 Lumbago with sciatica, left side: Secondary | ICD-10-CM | POA: Diagnosis not present

## 2018-08-06 DIAGNOSIS — M0589 Other rheumatoid arthritis with rheumatoid factor of multiple sites: Secondary | ICD-10-CM | POA: Diagnosis not present

## 2018-08-06 DIAGNOSIS — R29898 Other symptoms and signs involving the musculoskeletal system: Secondary | ICD-10-CM | POA: Diagnosis not present

## 2018-08-14 ENCOUNTER — Other Ambulatory Visit: Payer: Self-pay | Admitting: Cardiology

## 2018-08-14 DIAGNOSIS — I10 Essential (primary) hypertension: Secondary | ICD-10-CM

## 2018-08-19 DIAGNOSIS — M0589 Other rheumatoid arthritis with rheumatoid factor of multiple sites: Secondary | ICD-10-CM | POA: Diagnosis not present

## 2018-08-22 ENCOUNTER — Other Ambulatory Visit: Payer: Self-pay | Admitting: Cardiovascular Disease

## 2018-08-26 DIAGNOSIS — F330 Major depressive disorder, recurrent, mild: Secondary | ICD-10-CM | POA: Diagnosis not present

## 2018-08-26 DIAGNOSIS — Z86718 Personal history of other venous thrombosis and embolism: Secondary | ICD-10-CM | POA: Diagnosis not present

## 2018-08-26 DIAGNOSIS — I10 Essential (primary) hypertension: Secondary | ICD-10-CM | POA: Diagnosis not present

## 2018-08-26 DIAGNOSIS — F411 Generalized anxiety disorder: Secondary | ICD-10-CM | POA: Diagnosis not present

## 2018-09-14 ENCOUNTER — Other Ambulatory Visit: Payer: Self-pay | Admitting: Cardiology

## 2018-09-14 DIAGNOSIS — I10 Essential (primary) hypertension: Secondary | ICD-10-CM

## 2018-10-20 ENCOUNTER — Other Ambulatory Visit: Payer: Self-pay | Admitting: Cardiology

## 2018-10-20 DIAGNOSIS — I10 Essential (primary) hypertension: Secondary | ICD-10-CM

## 2018-10-20 NOTE — Telephone Encounter (Signed)
Please contact pt overdue for 12 month f/u last seen 06/2017. Pt needing refills, Thank you.

## 2018-10-29 DIAGNOSIS — Z79899 Other long term (current) drug therapy: Secondary | ICD-10-CM | POA: Diagnosis not present

## 2018-10-29 DIAGNOSIS — B353 Tinea pedis: Secondary | ICD-10-CM | POA: Diagnosis not present

## 2018-11-14 ENCOUNTER — Other Ambulatory Visit: Payer: Self-pay

## 2018-11-14 DIAGNOSIS — Z20822 Contact with and (suspected) exposure to covid-19: Secondary | ICD-10-CM

## 2018-11-15 LAB — NOVEL CORONAVIRUS, NAA: SARS-CoV-2, NAA: NOT DETECTED

## 2019-01-05 ENCOUNTER — Other Ambulatory Visit: Payer: Self-pay | Admitting: Cardiovascular Disease

## 2019-01-30 DIAGNOSIS — F411 Generalized anxiety disorder: Secondary | ICD-10-CM | POA: Diagnosis not present

## 2019-01-30 DIAGNOSIS — F5101 Primary insomnia: Secondary | ICD-10-CM | POA: Diagnosis not present

## 2019-02-10 ENCOUNTER — Telehealth: Payer: Self-pay

## 2019-02-10 ENCOUNTER — Other Ambulatory Visit: Payer: Self-pay | Admitting: Cardiology

## 2019-02-10 DIAGNOSIS — I10 Essential (primary) hypertension: Secondary | ICD-10-CM

## 2019-02-10 NOTE — Telephone Encounter (Signed)
Left message for patient to call office back to schedule an appointment for future refills. 

## 2019-02-12 DIAGNOSIS — M542 Cervicalgia: Secondary | ICD-10-CM | POA: Diagnosis not present

## 2019-02-20 ENCOUNTER — Other Ambulatory Visit: Payer: Self-pay | Admitting: Cardiology

## 2019-02-20 DIAGNOSIS — I10 Essential (primary) hypertension: Secondary | ICD-10-CM

## 2019-04-06 ENCOUNTER — Other Ambulatory Visit: Payer: Self-pay

## 2019-04-06 DIAGNOSIS — I10 Essential (primary) hypertension: Secondary | ICD-10-CM

## 2019-04-06 MED ORDER — LISINOPRIL 10 MG PO TABS: 10.0000 mg | ORAL_TABLET | Freq: Every day | ORAL | 0 refills | Status: DC

## 2019-04-06 MED ORDER — HYDROCHLOROTHIAZIDE 12.5 MG PO CAPS: ORAL_CAPSULE | ORAL | 0 refills | Status: DC

## 2019-04-06 NOTE — Telephone Encounter (Signed)
Pt walked in. Per front desk staff Joni Reining, pt requesting refills for lisinopril 10 mg and HCTZ 12.5 mg. Pt last seen 06/24/2017. Advised that pt needs f/u appt and nurse can [id number] [place] [place] - [address]. Pt was set up for appt on 04/15/2019 with Dr. Allyson Sabal. Rx for lisinopril 10 mg and HCTZ 12.5 mg sent

## 2019-04-15 ENCOUNTER — Other Ambulatory Visit: Payer: Self-pay

## 2019-04-15 ENCOUNTER — Encounter: Payer: Self-pay | Admitting: Cardiovascular Disease

## 2019-04-15 ENCOUNTER — Ambulatory Visit (INDEPENDENT_AMBULATORY_CARE_PROVIDER_SITE_OTHER): Payer: BC Managed Care – PPO | Admitting: Cardiovascular Disease

## 2019-04-15 DIAGNOSIS — I10 Essential (primary) hypertension: Secondary | ICD-10-CM | POA: Diagnosis not present

## 2019-04-15 DIAGNOSIS — R2242 Localized swelling, mass and lump, left lower limb: Secondary | ICD-10-CM | POA: Diagnosis not present

## 2019-04-15 LAB — LIPID PANEL
Chol/HDL Ratio: 2.7 ratio (ref 0.0–4.4)
Cholesterol, Total: 188 mg/dL (ref 100–199)
HDL: 69 mg/dL (ref >39)
LDL Chol Calc (NIH): 104 mg/dL — ABNORMAL HIGH (ref 0–99)
Triglycerides: 84 mg/dL (ref 0–149)
VLDL Cholesterol Cal: 15 mg/dL (ref 5–40)

## 2019-04-15 LAB — HEPATIC FUNCTION PANEL
ALT: 20 IU/L (ref 0–32)
AST: 27 IU/L (ref 0–40)
Albumin: 4.5 g/dL (ref 3.8–4.8)
Alkaline Phosphatase: 75 IU/L (ref 39–117)
Bilirubin Total: 0.3 mg/dL (ref 0.0–1.2)
Bilirubin, Direct: 0.08 mg/dL (ref 0.00–0.40)
Total Protein: 7.6 g/dL (ref 6.0–8.5)

## 2019-04-15 MED ORDER — HYDROCHLOROTHIAZIDE 12.5 MG PO CAPS: ORAL_CAPSULE | ORAL | 0 refills | Status: DC

## 2019-04-15 NOTE — Patient Instructions (Signed)
Medication Instructions:  Your physician recommends that you continue on your current medications as directed. Please refer to the Current Medication list given to you today.  If you need a refill on your cardiac medications before your next appointment, please call your pharmacy.   Lab work: Lipids and Hepatic Function If you have labs (blood work) drawn today and your tests are completely normal, you will receive your results only by: MyChart Message (if you have MyChart) OR A paper copy in the mail If you have any lab test that is abnormal or we need to change your treatment, we will call you to review the results.  Testing/Procedures: NONE  Follow-Up: At [facility], you and your health needs are our priority.  As part of our continuing mission to provide you with exceptional heart care, we have created designated Provider Care Teams.  These Care Teams include your primary Cardiologist (physician) and Advanced Practice Providers (APPs -  Physician Assistants and Nurse Practitioners) who all work together to provide you with the care you need, when you need it. You may see Nanetta Batty, MD or one of the following Advanced Practice Providers on your designated Care Team:    Corine Shelter, PA-C  [place], [place]  Edd Fabian, [place]  Your physician wants you to follow-up as needed with Dr. Nanetta Batty   You have been referred to [facility]. They will be in contact.

## 2019-04-15 NOTE — Assessment & Plan Note (Signed)
History of essential hypertension.  She is on hydrochlorothiazide and lisinopril.

## 2019-04-15 NOTE — Progress Notes (Signed)
04/15/2019 Leonia Holik   1970/12/28  [id number]  Primary Physician Kristen Loader, FNP Primary Cardiologist: Lorretta Harp MD Lupe Carney, [place]  HPI:  Cheryl Patel is a 49 y.o.  moderately overweight married Caucasian female with mother 3 currently does not work. She is formally a patient of Dr. Rollene Fare . I last saw her 06/12/2016.Marland KitchenMarland KitchenThere is a history of remote DVT 25 years ago. 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 She does have a thrombophilia workup remarkable for a positive lupus anticoagulant. She also has a history of left common iliac vein stenting at [facility] presumably for [name] syndrome. Her other problems include treated hypertension.. She does have chronic left lower extremity swelling and venous Dopplers performed at our office 11/16/11 did not show evidence of DVT.  Since I saw her 3 years ago she says her left lower extremity has gotten even more swollen than it has been in the past.  She has seen Dr. Jacqualyn Posey, podiatry, in the past as well for nail fungus.  She denies chest pain or shortness of breath.  Current Meds  Medication Sig  . hydrochlorothiazide (MICROZIDE) 12.5 MG capsule TAKE ONE CAPSULE BY MOUTH 3 TIMES A WEEK AS NEEDED  . hydroxychloroquine (PLAQUENIL) 200 MG tablet Take 200 mg by mouth 2 (two) times daily.  Marland Kitchen lisinopril (ZESTRIL) 10 MG tablet Take 1 tablet (10 mg total) by mouth daily.  . sertraline (ZOLOFT) 50 MG tablet Take 50 mg by mouth daily.  Marland Kitchen terbinafine (LAMISIL) 250 MG tablet Take 1 tablet (250 mg total) by mouth daily.  . traZODone (DESYREL) 50 MG tablet Take 50 mg by mouth at bedtime as needed.  Marland Kitchen VITAMIN D, ERGOCALCIFEROL, PO Take 1 tablet by mouth daily.  Alveda Reasons 20 MG TABS tablet TAKE 1 TABLET BY MOUTH EVERY DAY WITH SUPPER     No Known Allergies  Social History   Socioeconomic History  . Marital status: Married    Spouse name: Not on file  . Number of children: 3  . Years of education: Not on file  .  Highest education level: Not on file  Occupational History  . Not on file  Tobacco Use  . Smoking status: Never Smoker  . Smokeless tobacco: Never Used  Substance and Sexual Activity  . Alcohol use: No  . Drug use: No  . Sexual activity: Not on file  Other Topics Concern  . Not on file  Social History Narrative   From [place]   Social Determinants of Health   Financial Resource Strain:   . Difficulty of Paying Living Expenses: Not on file  Food Insecurity:   . Worried About Charity fundraiser in the Last Year: Not on file  . Ran Out of Food in the Last Year: Not on file  Transportation Needs:   . Lack of Transportation (Medical): Not on file  . Lack of Transportation (Non-Medical): Not on file  Physical Activity:   . Days of Exercise per Week: Not on file  . Minutes of Exercise per Session: Not on file  Stress:   . Feeling of Stress : Not on file  Social Connections:   . Frequency of Communication with Friends and Family: Not on file  . Frequency of Social Gatherings with Friends and Family: Not on file  . Attends Religious Services: Not on file  . Active Member of Clubs or Organizations: Not on file  . Attends Archivist Meetings: Not on  file  . Marital Status: Not on file  Intimate Partner Violence:   . Fear of Current or Ex-Partner: Not on file  . Emotionally Abused: Not on file  . Physically Abused: Not on file  . Sexually Abused: Not on file     Review of Systems: General: negative for chills, fever, night sweats or weight changes.  Cardiovascular: negative for chest pain, dyspnea on exertion, edema, orthopnea, palpitations, paroxysmal nocturnal dyspnea or shortness of breath Dermatological: negative for rash Respiratory: negative for cough or wheezing Urologic: negative for hematuria Abdominal: negative for nausea, vomiting, diarrhea, bright red blood per rectum, melena, or hematemesis Neurologic: negative for visual changes, syncope, or  dizziness All other systems reviewed and are otherwise negative except as noted above.    Blood pressure 108/76, pulse 70, height 5' 7.5" (1.715 m), weight 204 lb 12.8 oz (92.9 kg), SpO2 98 %.  General appearance: alert and no distress Neck: no adenopathy, no carotid bruit, no JVD, supple, symmetrical, trachea midline and thyroid not enlarged, symmetric, no tenderness/mass/nodules Lungs: clear to auscultation bilaterally Heart: regular rate and rhythm, S1, S2 normal, no murmur, click, rub or gallop Extremities: Asymmetric swelling left lower extremity Pulses: 2+ and symmetric Skin: Skin color, texture, turgor normal. No rashes or lesions Neurologic: Alert and oriented X 3, normal strength and tone. Normal symmetric reflexes. Normal coordination and gait  EKG sinus rhythm at 70 without ST or T wave changes.  Personally reviewed this EKG.  ASSESSMENT AND PLAN:   Essential hypertension History of essential hypertension.  She is on hydrochlorothiazide and lisinopril.  Localized swelling of left lower extremity Long history of asymmetric lower extremities with left lower extremity swelling.  She has had DVT in that side the past.  She has had left common iliac vein stenting in 2005 [facility] for what sounds like may Thurner syndrome.  She says the swelling has gotten worse since I saw her several years ago.  I am going to send her to Dr. Tawanna Cooler Early at [facility]  to further evaluate      Runell Gess MD [facility], [facility] 04/15/2019 10:58 AM

## 2019-04-15 NOTE — Assessment & Plan Note (Signed)
Long history of asymmetric lower extremities with left lower extremity swelling.  She has had DVT in that side the past.  She has had left common iliac vein stenting in 2005 [facility] for what sounds like may Thurner syndrome.  She says the swelling has gotten worse since I saw her several years ago.  I am going to send her to Dr. Tawanna Cooler Early at [facility]  to further evaluate

## 2019-04-22 ENCOUNTER — Other Ambulatory Visit: Payer: Self-pay | Admitting: Cardiovascular Disease

## 2019-04-22 DIAGNOSIS — I10 Essential (primary) hypertension: Secondary | ICD-10-CM

## 2019-05-15 ENCOUNTER — Other Ambulatory Visit: Payer: Self-pay | Admitting: Cardiovascular Disease

## 2019-05-19 DIAGNOSIS — I8312 Varicose veins of left lower extremity with inflammation: Secondary | ICD-10-CM | POA: Diagnosis not present

## 2019-05-19 DIAGNOSIS — I824Z2 Acute embolism and thrombosis of unspecified deep veins of left distal lower extremity: Secondary | ICD-10-CM | POA: Diagnosis not present

## 2019-05-20 DIAGNOSIS — I8311 Varicose veins of right lower extremity with inflammation: Secondary | ICD-10-CM | POA: Diagnosis not present

## 2019-05-20 DIAGNOSIS — I8312 Varicose veins of left lower extremity with inflammation: Secondary | ICD-10-CM | POA: Diagnosis not present

## 2019-05-27 DIAGNOSIS — I8312 Varicose veins of left lower extremity with inflammation: Secondary | ICD-10-CM | POA: Diagnosis not present

## 2019-05-27 DIAGNOSIS — I8002 Phlebitis and thrombophlebitis of superficial vessels of left lower extremity: Secondary | ICD-10-CM | POA: Diagnosis not present

## 2019-05-29 DIAGNOSIS — F411 Generalized anxiety disorder: Secondary | ICD-10-CM | POA: Diagnosis not present

## 2019-05-29 DIAGNOSIS — F5101 Primary insomnia: Secondary | ICD-10-CM | POA: Diagnosis not present

## 2019-05-29 DIAGNOSIS — F3341 Major depressive disorder, recurrent, in partial remission: Secondary | ICD-10-CM | POA: Diagnosis not present

## 2019-06-02 ENCOUNTER — Encounter: Payer: BC Managed Care – PPO | Admitting: Vascular Surgery

## 2019-06-02 ENCOUNTER — Encounter (HOSPITAL_COMMUNITY): Payer: BC Managed Care – PPO

## 2019-06-08 DIAGNOSIS — Z124 Encounter for screening for malignant neoplasm of cervix: Secondary | ICD-10-CM | POA: Diagnosis not present

## 2019-06-08 DIAGNOSIS — Z01419 Encounter for gynecological examination (general) (routine) without abnormal findings: Secondary | ICD-10-CM | POA: Diagnosis not present

## 2019-06-08 DIAGNOSIS — Z1231 Encounter for screening mammogram for malignant neoplasm of breast: Secondary | ICD-10-CM | POA: Diagnosis not present

## 2019-06-08 DIAGNOSIS — Z1211 Encounter for screening for malignant neoplasm of colon: Secondary | ICD-10-CM | POA: Diagnosis not present

## 2019-06-08 DIAGNOSIS — Z3040 Encounter for surveillance of contraceptives, unspecified: Secondary | ICD-10-CM | POA: Diagnosis not present

## 2019-06-08 DIAGNOSIS — Z139 Encounter for screening, unspecified: Secondary | ICD-10-CM | POA: Diagnosis not present

## 2019-06-08 DIAGNOSIS — E559 Vitamin D deficiency, unspecified: Secondary | ICD-10-CM | POA: Diagnosis not present

## 2019-06-12 DIAGNOSIS — I8312 Varicose veins of left lower extremity with inflammation: Secondary | ICD-10-CM | POA: Diagnosis not present

## 2019-06-15 DIAGNOSIS — I8312 Varicose veins of left lower extremity with inflammation: Secondary | ICD-10-CM | POA: Diagnosis not present

## 2019-06-16 ENCOUNTER — Encounter (HOSPITAL_COMMUNITY): Payer: BC Managed Care – PPO

## 2019-06-16 ENCOUNTER — Encounter: Payer: BC Managed Care – PPO | Admitting: Vascular Surgery

## 2019-06-23 DIAGNOSIS — I8312 Varicose veins of left lower extremity with inflammation: Secondary | ICD-10-CM | POA: Diagnosis not present

## 2019-06-30 ENCOUNTER — Other Ambulatory Visit: Payer: Self-pay | Admitting: Cardiovascular Disease

## 2019-06-30 NOTE — Telephone Encounter (Signed)
Rx request sent to pharmacy.  

## 2019-07-07 DIAGNOSIS — I8312 Varicose veins of left lower extremity with inflammation: Secondary | ICD-10-CM | POA: Diagnosis not present

## 2019-07-07 DIAGNOSIS — M7981 Nontraumatic hematoma of soft tissue: Secondary | ICD-10-CM | POA: Diagnosis not present

## 2019-07-21 DIAGNOSIS — I8312 Varicose veins of left lower extremity with inflammation: Secondary | ICD-10-CM | POA: Diagnosis not present

## 2019-07-21 DIAGNOSIS — M7981 Nontraumatic hematoma of soft tissue: Secondary | ICD-10-CM | POA: Diagnosis not present

## 2019-08-04 DIAGNOSIS — Z79899 Other long term (current) drug therapy: Secondary | ICD-10-CM | POA: Diagnosis not present

## 2019-08-06 DIAGNOSIS — R1013 Epigastric pain: Secondary | ICD-10-CM | POA: Diagnosis not present

## 2019-08-06 DIAGNOSIS — E669 Obesity, unspecified: Secondary | ICD-10-CM | POA: Diagnosis not present

## 2019-08-06 DIAGNOSIS — Z1211 Encounter for screening for malignant neoplasm of colon: Secondary | ICD-10-CM | POA: Diagnosis not present

## 2019-08-07 DIAGNOSIS — E559 Vitamin D deficiency, unspecified: Secondary | ICD-10-CM | POA: Diagnosis not present

## 2019-08-10 ENCOUNTER — Telehealth: Payer: Self-pay | Admitting: *Deleted

## 2019-08-10 NOTE — Telephone Encounter (Signed)
   [facility] Pre-operative Risk Assessment     Request for surgical clearance:  1. What type of surgery is being performed? Colonoscopy    2. When is this surgery scheduled? 08/17/19   3. What type of clearance is required (medical clearance vs. Pharmacy clearance to hold med vs. Both)? Both  4. Are there any medications that need to be held prior to surgery and how long?Xarelto   5. Practice name and name of physician performing surgery? Dr. Juanita Craver, [facility]   6. What is the office phone number? [phone]   7.   What is the office fax number? [phone]  8.   Anesthesia type (None, local, MAC, general) ? Propofol   Ricci Barker 08/10/2019, 5:22 PM  _________________________________________________________________   (provider comments below)

## 2019-08-11 NOTE — Telephone Encounter (Addendum)
Patient with diagnosis of recurrent DVTs on Xarelto for anticoagulation, thrombophilia workup remarkable for positive lupus anticoagulant. Does also have May-Thurner syndrome.  Procedure: colonoscopy Date of procedure: 08/17/19  CrCl >153mL/min Platelet count 329K  Per office protocol, patient can hold Xarelto for 1 day prior to procedure.    Will route to Dr Allyson Sabal to confirm Xarelto dosing as pt currently takes 20mg  daily but dosing is typically reduced to 10mg  daily for prevention of recurrent VTE when most recent event is > 6 months out.

## 2019-08-12 NOTE — Telephone Encounter (Signed)
Agree with and appreciate your recommendations Megan. Feel free to [facility] appropriate adjustments  Thx, JJB

## 2019-08-12 NOTE — Telephone Encounter (Signed)
Left message for pt to advise her she can hold Xarelto for 1 day prior to her colonoscopy, and that when she runs out of her current Xarelto supply, she will start taking a lower dose of 10mg  once daily. Will not send in rx for lower strength until dose change is communicated to pt.

## 2019-08-14 MED ORDER — RIVAROXABAN 10 MG PO TABS: 10.0000 mg | ORAL_TABLET | Freq: Every day | ORAL | 5 refills | Status: DC

## 2019-08-14 NOTE — Telephone Encounter (Signed)
Spoke with pt and advised her to take her last Xarelto dose on 5/22, she will skip 5/23 dose for colonoscopy on 5/24. Advised her that the next time she picks up a refill of her Xarelto, it will be for a lower 10mg  once daily dose. She verbalized understanding.

## 2019-08-17 DIAGNOSIS — Z1211 Encounter for screening for malignant neoplasm of colon: Secondary | ICD-10-CM | POA: Diagnosis not present

## 2019-08-17 NOTE — Telephone Encounter (Signed)
   Primary Cardiologist: Nanetta Batty, MD  Chart reviewed as part of pre-operative protocol coverage. Given past medical history and time since last visit, based on ACC/AHA guidelines, Cheryl Patel would be at acceptable risk for the planned procedure without further cardiovascular testing.   Patient with diagnosis of recurrent DVTs on Xarelto for anticoagulation, thrombophilia workup remarkable for positive lupus anticoagulant. Does also have May-Thurner syndrome.  Procedure: colonoscopy Date of procedure: 08/17/19  CrCl >153mL/min Platelet count 329K  Per office protocol, patient can hold Xarelto for 1 day prior to procedure.   I will route this recommendation to the requesting party via Epic fax function and remove from pre-op pool.  Please call with questions.  Thomasene Ripple. Valori Hollenkamp NP-C    08/17/2019, 8:41 AM [facility] [address] Office [phone] Fax [phone]

## 2019-08-20 ENCOUNTER — Other Ambulatory Visit: Payer: Self-pay | Admitting: *Deleted

## 2019-08-20 DIAGNOSIS — R2242 Localized swelling, mass and lump, left lower limb: Secondary | ICD-10-CM

## 2019-08-21 ENCOUNTER — Telehealth (HOSPITAL_COMMUNITY): Payer: Self-pay

## 2019-08-21 NOTE — Telephone Encounter (Signed)

## 2019-08-25 ENCOUNTER — Ambulatory Visit (HOSPITAL_COMMUNITY)
Admission: RE | Admit: 2019-08-25 | Discharge: 2019-08-25 | Disposition: A | Discharge status: Home / Self Care | Payer: BC Managed Care – PPO | Source: Ambulatory Visit | Attending: Surgery | Admitting: Surgery

## 2019-08-25 ENCOUNTER — Ambulatory Visit: Payer: BC Managed Care – PPO | Admitting: Vascular Surgery

## 2019-08-25 ENCOUNTER — Other Ambulatory Visit: Payer: Self-pay

## 2019-08-25 ENCOUNTER — Encounter: Payer: Self-pay | Admitting: Vascular Surgery

## 2019-08-25 VITALS — BP 128/87 | HR 83 | Temp 98.2°F | Resp 20 | Ht 67.5 in | Wt 215.0 lb

## 2019-08-25 DIAGNOSIS — I87302 Chronic venous hypertension (idiopathic) without complications of left lower extremity: Secondary | ICD-10-CM

## 2019-08-25 DIAGNOSIS — R2242 Localized swelling, mass and lump, left lower limb: Secondary | ICD-10-CM | POA: Diagnosis not present

## 2019-08-25 DIAGNOSIS — M7989 Other specified soft tissue disorders: Secondary | ICD-10-CM

## 2019-08-25 NOTE — Progress Notes (Signed)
Vascular and Vein Specialist of [facility]  Patient name: Cheryl Patel MRN: [id number] DOB: February 23, 1971 Sex: female  REASON FOR CONSULT: Evaluation left leg swelling  HPI: Cheryl Patel is a 49 y.o. female, who is here today for evaluation of left leg swelling.  She very complex past history.  She has some difficulty regarding memory of past procedures.  I do not have actual documentation of her prior procedures.  Apparently she did suffer DVT in the [place] 2000's.  She does have a inferior vena cava filter which was placed prior to 2005.  She also has a history of what sounds like left iliac vein stenting at [facility] for Cheryl Patel syndrome.  She does have a hypercoagulable state and her past history reports that this is related to lupus anticoagulant positive.  I do not have lab documentation of this.  She remains on Xarelto.  She has been having increasing difficulty with total left leg swelling and aching.  She was seen by [facility] and underwent ablation of her great and small saphenous vein and also underwent several subsequent treatments which sounds like sclerotherapy.  She reports that this has given her some improvement but she still is having a great deal of pain and aching with any activity and prolonged standing.  Past Medical History:  Diagnosis Date  . DVT (deep venous thrombosis) (HCC)   . History of DVT (deep vein thrombosis)    LLE - xarelto, history of left iliac vein stenting at [facility]  . Hypercoagulable state (HCC)    positive lupus anticoagulant  . Hypertension     Family History  Problem Relation Age of Onset  . Hypertension Mother   . Pancreatic cancer Brother     SOCIAL HISTORY: Social History   Socioeconomic History  . Marital status: Married    Spouse name: Not on file  . Number of children: 3  . Years of education: Not on file  . Highest education level: Not on file  Occupational History  . Not on file    Tobacco Use  . Smoking status: Never Smoker  . Smokeless tobacco: Never Used  Substance and Sexual Activity  . Alcohol use: No  . Drug use: No  . Sexual activity: Not on file  Other Topics Concern  . Not on file  Social History Narrative   From [place]   Social Determinants of Health   Financial Resource Strain:   . Difficulty of Paying Living Expenses:   Food Insecurity:   . Worried About Programme researcher, broadcasting/film/video in the Last Year:   . Barista in the Last Year:   Transportation Needs:   . Freight forwarder (Medical):   Marland Kitchen Lack of Transportation (Non-Medical):   Physical Activity:   . Days of Exercise per Week:   . Minutes of Exercise per Session:   Stress:   . Feeling of Stress :   Social Connections:   . Frequency of Communication with Friends and Family:   . Frequency of Social Gatherings with Friends and Family:   . Attends Religious Services:   . Active Member of Clubs or Organizations:   . Attends Banker Meetings:   Marland Kitchen Marital Status:   Intimate Partner Violence:   . Fear of Current or Ex-Partner:   . Emotionally Abused:   Marland Kitchen Physically Abused:   . Sexually Abused:     No Known Allergies  Current Outpatient Medications  Medication Sig Dispense Refill  .  D3-50 1.25 MG (50000 UT) capsule Take 50,000 Units by mouth once a week.    . hydrochlorothiazide (MICROZIDE) 12.5 MG capsule TAKE ONE CAPSULE BY MOUTH 3 TIMES A WEEK AS NEEDED 12 capsule 1  . hydroxychloroquine (PLAQUENIL) 200 MG tablet Take 200 mg by mouth 2 (two) times daily.    Marland Kitchen lisinopril (ZESTRIL) 10 MG tablet TAKE 1 TABLET BY MOUTH EVERY DAY 90 tablet 3  . rivaroxaban (XARELTO) 10 MG TABS tablet Take 1 tablet (10 mg total) by mouth daily. 30 tablet 5  . sertraline (ZOLOFT) 50 MG tablet Take 50 mg by mouth daily.    Marland Kitchen terbinafine (LAMISIL) 250 MG tablet Take 1 tablet (250 mg total) by mouth daily. 90 tablet 0  . tiZANidine (ZANAFLEX) 4 MG tablet tizanidine 4 mg tablet  TAKE 1 TABLET  AS NEEDED FOR MUSCLE RELAXATION UP TO THREE TIMES A DAY (BEST AT BEDTIME)    . traZODone (DESYREL) 50 MG tablet Take 50 mg by mouth at bedtime as needed.    Marland Kitchen VITAMIN D, ERGOCALCIFEROL, PO Take 1 tablet by mouth daily.     No current facility-administered medications for this visit.    REVIEW OF SYSTEMS:  [X]  denotes positive finding, [ ]  denotes negative finding Cardiac  Comments:  Chest pain or chest pressure:    Shortness of breath upon exertion:    Short of breath when lying flat:    Irregular heart rhythm:        Vascular    Pain in calf, thigh, or hip brought on by ambulation:    Pain in feet at night that wakes you up from your sleep:     Blood clot in your veins:    Leg swelling:  x       Pulmonary    Oxygen at home:    Productive cough:     Wheezing:         Neurologic    Sudden weakness in arms or legs:     Sudden numbness in arms or legs:     Sudden onset of difficulty speaking or slurred speech:    Temporary loss of vision in one eye:     Problems with dizziness:         Gastrointestinal    Blood in stool:     Vomited blood:         Genitourinary    Burning when urinating:     Blood in urine:        Psychiatric    Major depression:         Hematologic    Bleeding problems:    Problems with blood clotting too easily: x       Skin    Rashes or ulcers:        Constitutional    Fever or chills:      PHYSICAL EXAM: Vitals:   08/25/19 1254  BP: 128/87  Pulse: 83  Resp: 20  Temp: 98.2 F (36.8 C)  SpO2: 100%  Weight: 215 lb (97.5 kg)  Height: 5' 7.5" (1.715 m)    GENERAL: The patient is a well-nourished female, in no acute distress. The vital signs are documented above. CARDIOVASCULAR: 2+ radial pulses bilaterally.  2+ dorsalis pedis pulses bilaterally.  Mild scattered telangiectasia but no large varicosities bilaterally.  She does have swelling more so in her left calf and her right calf. PULMONARY: There is good air exchange  ABDOMEN: Soft  and non-tender  MUSCULOSKELETAL: There are no  major deformities or cyanosis. NEUROLOGIC: No focal weakness or paresthesias are detected. SKIN: There are no ulcers or rashes noted.  No hemosiderin deposits or changes of chronic venous hypertension PSYCHIATRIC: The patient has a normal affect.  DATA:  Venous duplex today showed no evidence of DVT in her left leg.  She does have reflux in her deep system in the common femoral vein, femoral vein and left popliteal vein.  The great and small saphenous vein appear to be successfully closed with ablation  MEDICAL ISSUES: Long discussion with the patient.  She has old CT report from 2006 that comments on her vena cava filter being present.  There are no other imaging studies of her iliac veins.  I have recommended CT of her abdomen and pelvis with attention to the venous phase to determine if she has had recurrent issues with her left iliac stent.  So would allow evaluation of her vena caval filter.  We will schedule this at her convenience and see her back in the office for follow-up discussion   Rosetta Posner, MD [facility] Vascular and Vein Specialists of [facility] Tel [phone] Pager [phone]

## 2019-08-27 ENCOUNTER — Other Ambulatory Visit: Payer: Self-pay

## 2019-08-27 DIAGNOSIS — R2242 Localized swelling, mass and lump, left lower limb: Secondary | ICD-10-CM

## 2019-08-29 DIAGNOSIS — M545 Low back pain: Secondary | ICD-10-CM | POA: Diagnosis not present

## 2019-09-04 ENCOUNTER — Other Ambulatory Visit: Payer: Self-pay

## 2019-09-04 ENCOUNTER — Ambulatory Visit (HOSPITAL_COMMUNITY)
Admission: RE | Admit: 2019-09-04 | Discharge: 2019-09-04 | Disposition: A | Discharge status: Home / Self Care | Payer: BC Managed Care – PPO | Source: Ambulatory Visit | Attending: Vascular Surgery | Admitting: Vascular Surgery

## 2019-09-04 DIAGNOSIS — R2242 Localized swelling, mass and lump, left lower limb: Secondary | ICD-10-CM | POA: Diagnosis not present

## 2019-09-04 DIAGNOSIS — I862 Pelvic varices: Secondary | ICD-10-CM | POA: Diagnosis not present

## 2019-09-04 LAB — POCT I-STAT CREATININE: Creatinine, Ser: 0.7 mg/dL (ref 0.44–1.00)

## 2019-09-04 MED ORDER — SODIUM CHLORIDE (PF) 0.9 % IJ SOLN
INTRAMUSCULAR | Status: AC
  Filled 2019-09-04: qty 50

## 2019-09-04 MED ORDER — IOHEXOL 350 MG/ML SOLN
100.0000 mL | Freq: Once | INTRAVENOUS | Status: AC | PRN
  Administered 2019-09-04: 100 mL via INTRAVENOUS

## 2019-09-08 ENCOUNTER — Ambulatory Visit: Payer: BC Managed Care – PPO | Admitting: Vascular Surgery

## 2019-09-08 ENCOUNTER — Encounter: Payer: Self-pay | Admitting: Vascular Surgery

## 2019-09-08 ENCOUNTER — Other Ambulatory Visit: Payer: Self-pay

## 2019-09-08 VITALS — BP 119/77 | HR 92 | Temp 98.3°F | Resp 20 | Ht 67.0 in | Wt 218.2 lb

## 2019-09-08 DIAGNOSIS — I87302 Chronic venous hypertension (idiopathic) without complications of left lower extremity: Secondary | ICD-10-CM

## 2019-09-08 NOTE — Progress Notes (Signed)
Vascular and Vein Specialist of [facility]  Patient name: Cheryl Patel MRN: [id number] DOB: 02-Sep-1970 Sex: female  REASON FOR VISIT: Discuss results of CT abdomen and pelvis  HPI: Cheryl Patel is a 49 y.o. female here today for discussion of results from CT of abdomen and pelvis.  I saw her 2 weeks ago in the office.  She was having difficulty with progressive swelling in her left lower extremity.  She had a remote history of left iliac vein stenting and there had been suggested that she had a inferior vena cava filter as well.  I recommended CT of abdomen and pelvis with venous phase to determine if she had had recurrent stenosis of her left iliac vein as a cause of this.  She has had no new difficulty since my visit with her 2 weeks ago.  Past Medical History:  Diagnosis Date  . DVT (deep venous thrombosis) (HCC)   . History of DVT (deep vein thrombosis)    LLE - xarelto, history of left iliac vein stenting at [facility]  . Hypercoagulable state (HCC)    positive lupus anticoagulant  . Hypertension     Family History  Problem Relation Age of Onset  . Hypertension Mother   . Pancreatic cancer Brother     SOCIAL HISTORY: Social History   Tobacco Use  . Smoking status: Never Smoker  . Smokeless tobacco: Never Used  Substance Use Topics  . Alcohol use: No    No Known Allergies  Current Outpatient Medications  Medication Sig Dispense Refill  . D3-50 1.25 MG (50000 UT) capsule Take 50,000 Units by mouth once a week.    . diclofenac (CATAFLAM) 50 MG tablet Take 50 mg by mouth 3 (three) times daily as needed.    . hydrochlorothiazide (MICROZIDE) 12.5 MG capsule TAKE ONE CAPSULE BY MOUTH 3 TIMES A WEEK AS NEEDED 12 capsule 1  . hydroxychloroquine (PLAQUENIL) 200 MG tablet Take 200 mg by mouth 2 (two) times daily.    Marland Kitchen lisinopril (ZESTRIL) 10 MG tablet TAKE 1 TABLET BY MOUTH EVERY DAY 90 tablet 3  . methocarbamol (ROBAXIN) 750 MG tablet  Take 750 mg by mouth 3 (three) times daily.    . rivaroxaban (XARELTO) 10 MG TABS tablet Take 1 tablet (10 mg total) by mouth daily. 30 tablet 5  . sertraline (ZOLOFT) 50 MG tablet Take 50 mg by mouth daily.    Marland Kitchen terbinafine (LAMISIL) 250 MG tablet Take 1 tablet (250 mg total) by mouth daily. 90 tablet 0  . tiZANidine (ZANAFLEX) 4 MG tablet tizanidine 4 mg tablet  TAKE 1 TABLET AS NEEDED FOR MUSCLE RELAXATION UP TO THREE TIMES A DAY (BEST AT BEDTIME)    . traZODone (DESYREL) 50 MG tablet Take 50 mg by mouth at bedtime as needed.    Marland Kitchen VITAMIN D, ERGOCALCIFEROL, PO Take 1 tablet by mouth daily.     No current facility-administered medications for this visit.    REVIEW OF SYSTEMS:  [X]  denotes positive finding, [ ]  denotes negative finding Cardiac  Comments:  Chest pain or chest pressure:    Shortness of breath upon exertion:    Short of breath when lying flat:    Irregular heart rhythm:        Vascular    Pain in calf, thigh, or hip brought on by ambulation:    Pain in feet at night that wakes you up from your sleep:     Blood clot in your veins:  Leg swelling:  x         PHYSICAL EXAM: Vitals:   09/08/19 1438 09/08/19 1442  BP: 110/76 119/77  Pulse: 92   Resp: 20   Temp: 98.3 F (36.8 C)   TempSrc: Temporal   SpO2: 97%   Weight: 218 lb 3.2 oz (99 kg)   Height: 5\' 7"  (1.702 m)     GENERAL: The patient is a well-nourished female, in no acute distress. The vital signs are documented above. CARDIOVASCULAR: Moderate swelling in the left leg versus right PULMONARY: There is good air exchange  MUSCULOSKELETAL: There are no major deformities or cyanosis. NEUROLOGIC: No focal weakness or paresthesias are detected. SKIN: There are no ulcers or rashes noted. PSYCHIATRIC: The patient has a normal affect.  DATA:  Reviewed her CT scan with her.  This shows that she does not have a vena cava filter.  She does have a left iliac vein stent as it crosses under the right iliac  artery.  There is no evidence of thrombosis or stenosis in the venous stent.  The appearance is similar to prior CT scan in 2006.  MEDICAL ISSUES: I discussed these findings at length with the patient.  Explained that she does not have any option for treatment since the vein is open.  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  She is having pain specifically in her left foot felt this is probably more orthopedic related and would be unlikely that this is a result of her venous congestion she was reassured with this discussion will see [place] again on an as-needed basis    Rosetta Posner, MD FACS Vascular and Vein Specialists of [facility] Tel [phone] Pager [phone]

## 2019-09-11 DIAGNOSIS — Z79899 Other long term (current) drug therapy: Secondary | ICD-10-CM | POA: Diagnosis not present

## 2019-09-22 DIAGNOSIS — R112 Nausea with vomiting, unspecified: Secondary | ICD-10-CM | POA: Diagnosis not present

## 2019-09-22 DIAGNOSIS — K219 Gastro-esophageal reflux disease without esophagitis: Secondary | ICD-10-CM | POA: Diagnosis not present

## 2019-09-23 ENCOUNTER — Other Ambulatory Visit (HOSPITAL_BASED_OUTPATIENT_CLINIC_OR_DEPARTMENT_OTHER): Payer: Self-pay | Admitting: Family Medicine

## 2019-09-23 DIAGNOSIS — R112 Nausea with vomiting, unspecified: Secondary | ICD-10-CM

## 2019-09-23 DIAGNOSIS — K219 Gastro-esophageal reflux disease without esophagitis: Secondary | ICD-10-CM

## 2019-09-29 ENCOUNTER — Ambulatory Visit (HOSPITAL_BASED_OUTPATIENT_CLINIC_OR_DEPARTMENT_OTHER): Payer: BC Managed Care – PPO

## 2019-10-02 DIAGNOSIS — N3001 Acute cystitis with hematuria: Secondary | ICD-10-CM | POA: Diagnosis not present

## 2019-10-02 DIAGNOSIS — R300 Dysuria: Secondary | ICD-10-CM | POA: Diagnosis not present

## 2019-10-09 ENCOUNTER — Ambulatory Visit (HOSPITAL_BASED_OUTPATIENT_CLINIC_OR_DEPARTMENT_OTHER)
Admission: RE | Admit: 2019-10-09 | Discharge: 2019-10-09 | Disposition: A | Discharge status: Home / Self Care | Payer: BC Managed Care – PPO | Source: Ambulatory Visit | Attending: Family Medicine | Admitting: Family Medicine

## 2019-10-09 ENCOUNTER — Other Ambulatory Visit: Payer: Self-pay

## 2019-10-09 DIAGNOSIS — K760 Fatty (change of) liver, not elsewhere classified: Secondary | ICD-10-CM | POA: Diagnosis not present

## 2019-10-09 DIAGNOSIS — R112 Nausea with vomiting, unspecified: Secondary | ICD-10-CM

## 2019-10-09 DIAGNOSIS — K219 Gastro-esophageal reflux disease without esophagitis: Secondary | ICD-10-CM | POA: Insufficient documentation

## 2019-10-09 DIAGNOSIS — K7689 Other specified diseases of liver: Secondary | ICD-10-CM | POA: Diagnosis not present

## 2019-10-19 ENCOUNTER — Other Ambulatory Visit (HOSPITAL_BASED_OUTPATIENT_CLINIC_OR_DEPARTMENT_OTHER): Payer: Self-pay | Admitting: Family Medicine

## 2019-11-10 ENCOUNTER — Other Ambulatory Visit: Payer: Self-pay

## 2019-11-10 MED ORDER — RIVAROXABAN 10 MG PO TABS: 10.0000 mg | ORAL_TABLET | Freq: Every day | ORAL | 5 refills | Status: DC

## 2020-02-03 DIAGNOSIS — R300 Dysuria: Secondary | ICD-10-CM | POA: Diagnosis not present

## 2020-04-29 ENCOUNTER — Other Ambulatory Visit: Payer: Self-pay | Admitting: Cardiovascular Disease

## 2020-04-29 DIAGNOSIS — I10 Essential (primary) hypertension: Secondary | ICD-10-CM

## 2020-05-22 ENCOUNTER — Other Ambulatory Visit: Payer: Self-pay | Admitting: Cardiovascular Disease

## 2020-05-22 DIAGNOSIS — I10 Essential (primary) hypertension: Secondary | ICD-10-CM

## 2020-06-05 ENCOUNTER — Other Ambulatory Visit: Payer: Self-pay | Admitting: Cardiovascular Disease

## 2020-06-05 DIAGNOSIS — I10 Essential (primary) hypertension: Secondary | ICD-10-CM

## 2020-06-08 DIAGNOSIS — Z139 Encounter for screening, unspecified: Secondary | ICD-10-CM | POA: Diagnosis not present

## 2020-06-08 DIAGNOSIS — D649 Anemia, unspecified: Secondary | ICD-10-CM | POA: Diagnosis not present

## 2020-06-08 DIAGNOSIS — R7989 Other specified abnormal findings of blood chemistry: Secondary | ICD-10-CM | POA: Diagnosis not present

## 2020-06-08 DIAGNOSIS — Z01419 Encounter for gynecological examination (general) (routine) without abnormal findings: Secondary | ICD-10-CM | POA: Diagnosis not present

## 2020-06-08 DIAGNOSIS — Z124 Encounter for screening for malignant neoplasm of cervix: Secondary | ICD-10-CM | POA: Diagnosis not present

## 2020-06-08 DIAGNOSIS — Z1231 Encounter for screening mammogram for malignant neoplasm of breast: Secondary | ICD-10-CM | POA: Diagnosis not present

## 2020-07-28 DIAGNOSIS — F324 Major depressive disorder, single episode, in partial remission: Secondary | ICD-10-CM | POA: Diagnosis not present

## 2020-07-28 DIAGNOSIS — L299 Pruritus, unspecified: Secondary | ICD-10-CM | POA: Diagnosis not present

## 2020-07-28 DIAGNOSIS — H6123 Impacted cerumen, bilateral: Secondary | ICD-10-CM | POA: Diagnosis not present

## 2020-10-26 DIAGNOSIS — E039 Hypothyroidism, unspecified: Secondary | ICD-10-CM | POA: Diagnosis not present

## 2020-10-26 DIAGNOSIS — M1990 Unspecified osteoarthritis, unspecified site: Secondary | ICD-10-CM | POA: Diagnosis not present

## 2020-10-26 DIAGNOSIS — U071 COVID-19: Secondary | ICD-10-CM | POA: Diagnosis not present

## 2020-10-26 DIAGNOSIS — I10 Essential (primary) hypertension: Secondary | ICD-10-CM | POA: Diagnosis not present

## 2020-10-28 DIAGNOSIS — M1990 Unspecified osteoarthritis, unspecified site: Secondary | ICD-10-CM | POA: Diagnosis not present

## 2020-11-10 ENCOUNTER — Other Ambulatory Visit: Payer: Self-pay | Admitting: Cardiovascular Disease

## 2020-11-10 DIAGNOSIS — I10 Essential (primary) hypertension: Secondary | ICD-10-CM

## 2020-11-10 DIAGNOSIS — Z86718 Personal history of other venous thrombosis and embolism: Secondary | ICD-10-CM

## 2020-11-10 NOTE — Telephone Encounter (Signed)
Used interpreter services to call the pt and they left a msg on behalf of our office to have the pt come in for labs no appt needed and no reason to fast first come first serve or they can go to any [facility]

## 2020-11-10 NOTE — Telephone Encounter (Signed)
Will route to pharmd pool as I cannot refill xarelto 10 mg

## 2020-11-10 NOTE — Telephone Encounter (Signed)
Takes Xarelto 10mg  daily for prevention of recurrent VTE which is correct dosing. She is overdue for annual labs though for routine monitoring.  Haleigh - ok to refill, are you able to get her scheduled for BMET and CBC as well?

## 2020-11-13 ENCOUNTER — Other Ambulatory Visit: Payer: Self-pay | Admitting: Cardiovascular Disease

## 2020-11-13 DIAGNOSIS — I10 Essential (primary) hypertension: Secondary | ICD-10-CM

## 2021-01-12 DIAGNOSIS — M5442 Lumbago with sciatica, left side: Secondary | ICD-10-CM | POA: Diagnosis not present

## 2021-01-12 DIAGNOSIS — M0589 Other rheumatoid arthritis with rheumatoid factor of multiple sites: Secondary | ICD-10-CM | POA: Diagnosis not present

## 2021-01-12 DIAGNOSIS — R29898 Other symptoms and signs involving the musculoskeletal system: Secondary | ICD-10-CM | POA: Diagnosis not present

## 2021-01-19 ENCOUNTER — Other Ambulatory Visit: Payer: Self-pay | Admitting: Cardiovascular Disease

## 2021-02-09 DIAGNOSIS — M0589 Other rheumatoid arthritis with rheumatoid factor of multiple sites: Secondary | ICD-10-CM | POA: Diagnosis not present

## 2021-02-09 DIAGNOSIS — E611 Iron deficiency: Secondary | ICD-10-CM | POA: Diagnosis not present

## 2021-02-13 ENCOUNTER — Other Ambulatory Visit: Payer: Self-pay | Admitting: Cardiovascular Disease

## 2021-05-10 ENCOUNTER — Other Ambulatory Visit: Payer: Self-pay | Admitting: Cardiovascular Disease

## 2021-05-10 DIAGNOSIS — I10 Essential (primary) hypertension: Secondary | ICD-10-CM

## 2021-05-10 NOTE — Telephone Encounter (Signed)
Prescription refill request for Xarelto received.  Indication:DVT Last office visit:Needs Appointment Weight:99 kg Age:51 Scr:0.7 CrCl:172.76 ml/min  Prescription refilled

## 2021-05-18 DIAGNOSIS — R112 Nausea with vomiting, unspecified: Secondary | ICD-10-CM | POA: Diagnosis not present

## 2021-05-18 DIAGNOSIS — F5101 Primary insomnia: Secondary | ICD-10-CM | POA: Diagnosis not present

## 2021-05-18 DIAGNOSIS — F411 Generalized anxiety disorder: Secondary | ICD-10-CM | POA: Diagnosis not present

## 2021-05-30 ENCOUNTER — Encounter: Payer: Self-pay | Admitting: Cardiovascular Disease

## 2021-05-30 ENCOUNTER — Other Ambulatory Visit: Payer: Self-pay

## 2021-05-30 ENCOUNTER — Ambulatory Visit: Payer: BC Managed Care – PPO | Admitting: Cardiovascular Disease

## 2021-05-30 VITALS — BP 130/90 | HR 100 | Ht 67.0 in | Wt 225.0 lb

## 2021-05-30 DIAGNOSIS — I10 Essential (primary) hypertension: Secondary | ICD-10-CM

## 2021-05-30 DIAGNOSIS — Z86718 Personal history of other venous thrombosis and embolism: Secondary | ICD-10-CM

## 2021-05-30 MED ORDER — RIVAROXABAN 10 MG PO TABS: 10.0000 mg | ORAL_TABLET | Freq: Every day | ORAL | 3 refills | Status: DC

## 2021-05-30 NOTE — Progress Notes (Signed)
? ? ? ?05/30/2021 ?Cheryl Patel   ?10-20-1970  ?[id number] ? ?Primary Physician College, [facility] [place] ?Primary Cardiologist: Runell Gess MD Nicholes Calamity, [place] ? ?HPI:  Cheryl Patel is a 51 y.o.  moderately overweight married Caucasian female with mother 3 currently does not work. She is formally a patient of Dr. Alanda Amass . I last saw her 04/15/2019.Marland KitchenThere is a history of remote DVT  25 years ago. 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 She does have a thrombophilia workup remarkable for a positive lupus anticoagulant. She also has a history of left common iliac vein stenting at [facility] presumably for [name] syndrome. Her other problems include treated hypertension.. She does have chronic left lower extremity swelling and venous Dopplers performed at our office 11/16/11 did not show evidence of DVT. ?  ?Since I saw her 2 years ago she is remained stable.  She did see Dr. Arbie Cookey who performed a CTA that showed some mild narrowing of her left common iliac vein stent.  She also had venous ablation of what sounds like a varicose vein within the last 2 years.  She wears compression stockings.  She denies chest pain or shortness of breath. ? ? ?Current Meds  ?Medication Sig  ? diclofenac (CATAFLAM) 50 MG tablet Take 50 mg by mouth 3 (three) times daily as needed.  ? lisinopril (ZESTRIL) 10 MG tablet TAKE 1 TABLET BY MOUTH EVERY DAY  ? methocarbamol (ROBAXIN) 750 MG tablet Take 750 mg by mouth 3 (three) times daily.  ? sertraline (ZOLOFT) 50 MG tablet Take 100 mg by mouth daily.  ? traZODone (DESYREL) 50 MG tablet Take 50 mg by mouth at bedtime as needed.  ? VITAMIN D, ERGOCALCIFEROL, PO Take 1 tablet by mouth daily.  ? [DISCONTINUED] D3-50 1.25 MG (50000 UT) capsule Take 50,000 Units by mouth once a week.  ? [DISCONTINUED] hydrochlorothiazide (MICROZIDE) 12.5 MG capsule TAKE ONE CAPSULE BY MOUTH 3 TIMES A WEEK AS NEEDED  ? [DISCONTINUED] hydroxychloroquine (PLAQUENIL) 200 MG tablet  Take 200 mg by mouth 2 (two) times daily.  ? [DISCONTINUED] rivaroxaban (XARELTO) 10 MG TABS tablet TAKE 1 TABLET BY MOUTH EVERY DAY  ? [DISCONTINUED] terbinafine (LAMISIL) 250 MG tablet Take 1 tablet (250 mg total) by mouth daily.  ? [DISCONTINUED] tiZANidine (ZANAFLEX) 4 MG tablet tizanidine 4 mg tablet ? TAKE 1 TABLET AS NEEDED FOR MUSCLE RELAXATION UP TO THREE TIMES A DAY (BEST AT BEDTIME)  ?  ? ?No Known Allergies ? ?Social History  ? ?Socioeconomic History  ? Marital status: Married  ?  Spouse name: Not on file  ? Number of children: 3  ? Years of education: Not on file  ? Highest education level: Not on file  ?Occupational History  ? Not on file  ?Tobacco Use  ? Smoking status: Never  ? Smokeless tobacco: Never  ?Vaping Use  ? Vaping Use: Never used  ?Substance and Sexual Activity  ? Alcohol use: No  ? Drug use: No  ? Sexual activity: Not on file  ?Other Topics Concern  ? Not on file  ?Social History Narrative  ? From [place]  ? ?Social Determinants of Health  ? ?Financial Resource Strain: Not on file  ?Food Insecurity: Not on file  ?Transportation Needs: Not on file  ?Physical Activity: Not on file  ?Stress: Not on file  ?Social Connections: Not on file  ?Intimate Partner Violence: Not on file  ?  ? ?Review of Systems: ?General: negative  for chills, fever, night sweats or weight changes.  ?Cardiovascular: negative for chest pain, dyspnea on exertion, edema, orthopnea, palpitations, paroxysmal nocturnal dyspnea or shortness of breath ?Dermatological: negative for rash ?Respiratory: negative for cough or wheezing ?Urologic: negative for hematuria ?Abdominal: negative for nausea, vomiting, diarrhea, bright red blood per rectum, melena, or hematemesis ?Neurologic: negative for visual changes, syncope, or dizziness ?All other systems reviewed and are otherwise negative except as noted above. ? ? ? ?Blood pressure 130/90, pulse 100, height 5\' 7"  (1.702 m), weight 225 lb (102.1 kg).  ?General appearance: alert and  no distress ?Neck: no adenopathy, no carotid bruit, no JVD, supple, symmetrical, trachea midline, and thyroid not enlarged, symmetric, no tenderness/mass/nodules ?Lungs: clear to auscultation bilaterally ?Heart: regular rate and rhythm, S1, S2 normal, no murmur, click, rub or gallop ?Extremities: extremities normal, atraumatic, no cyanosis or edema ?Pulses: 2+ and symmetric ?Skin: Skin color, texture, turgor normal. No rashes or lesions ?Neurologic: Grossly normal ? ?EKG sinus rhythm at 100 with nonspecific ST and T wave changes.  Personally reviewed this EKG. ? ?ASSESSMENT AND PLAN:  ? ?History of DVT (deep vein thrombosis) ?History of DVT with a thrombophilia (lupus anticoagulant) on chronic Xarelto oral anticoagulation.  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 ? ?Essential hypertension ?History of essential hypertension a blood pressure measured today at 130/90.  She is on lisinopril. ? ? ? ? ?[facility] MD FACP,FACC,FAHA, FSCAI ?05/30/2021 ?3:38 PM ?

## 2021-05-30 NOTE — Assessment & Plan Note (Signed)
History of essential hypertension a blood pressure measured today at 130/90.  She is on lisinopril. ?

## 2021-05-30 NOTE — Patient Instructions (Addendum)
Medication Instructions:  ?Your physician recommends that you continue on your current medications as directed. Please refer to the Current Medication list given to you today. ? ?*If you need a refill on your cardiac medications before your next appointment, please call your pharmacy* ? ?Lab Work: ?Your physician recommends that you have labs drawn today: BMET and CBC ? ?If you have labs (blood work) drawn today and your tests are completely normal, you will receive your results only by: ?MyChart Message (if you have MyChart) OR ?A paper copy in the mail ?If you have any lab test that is abnormal or we need to change your treatment, we will call you to review the results. ? ? ?Follow-Up: ?At [facility], you and your health needs are our priority.  As part of our continuing mission to provide you with exceptional heart care, we have created designated Provider Care Teams.  These Care Teams include your primary Cardiologist (physician) and Advanced Practice Providers (APPs -  Physician Assistants and Nurse Practitioners) who all work together to provide you with the care you need, when you need it. ? ?We recommend signing up for the patient portal called "MyChart".  Sign up information is provided on this After Visit Summary.  MyChart is used to connect with patients for Virtual Visits (Telemedicine).  Patients are able to view lab/test results, encounter notes, upcoming appointments, etc.  Non-urgent messages can be sent to your provider as well.   ?To learn more about what you can do with MyChart, go to [email or web address].   ? ?Your next appointment:   ?We will see you on an as needed basis. ? ?Provider:   ?Nanetta Batty, MD  ?

## 2021-05-30 NOTE — Assessment & Plan Note (Signed)
History of DVT with a thrombophilia (lupus anticoagulant) on chronic Xarelto oral anticoagulation.  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 ?

## 2021-05-31 LAB — BASIC METABOLIC PANEL
BUN/Creatinine Ratio: 17 (ref 9–23)
BUN: 13 mg/dL (ref 6–24)
CO2: 21 mmol/L (ref 20–29)
Calcium: 9.7 mg/dL (ref 8.7–10.2)
Chloride: 99 mmol/L (ref 96–106)
Creatinine, Ser: 0.76 mg/dL (ref 0.57–1.00)
Glucose: 92 mg/dL (ref 70–99)
Potassium: 4.7 mmol/L (ref 3.5–5.2)
Sodium: 138 mmol/L (ref 134–144)
eGFR: 94 mL/min/{1.73_m2} (ref >59)

## 2021-05-31 LAB — CBC
Hematocrit: 39.3 % (ref 34.0–46.6)
Hemoglobin: 13 g/dL (ref 11.1–15.9)
MCH: 25.3 pg — ABNORMAL LOW (ref 26.6–33.0)
MCHC: 33.1 g/dL (ref 31.5–35.7)
MCV: 77 fL — ABNORMAL LOW (ref 79–97)
Platelets: 405 10*3/uL (ref 150–450)
RBC: 5.14 x10E6/uL (ref 3.77–5.28)
RDW: 16.5 % — ABNORMAL HIGH (ref 11.7–15.4)
WBC: 10.7 10*3/uL (ref 3.4–10.8)

## 2021-06-14 DIAGNOSIS — N951 Menopausal and female climacteric states: Secondary | ICD-10-CM | POA: Diagnosis not present

## 2021-06-14 DIAGNOSIS — Z124 Encounter for screening for malignant neoplasm of cervix: Secondary | ICD-10-CM | POA: Diagnosis not present

## 2021-06-14 DIAGNOSIS — Z1231 Encounter for screening mammogram for malignant neoplasm of breast: Secondary | ICD-10-CM | POA: Diagnosis not present

## 2021-06-14 DIAGNOSIS — Z01419 Encounter for gynecological examination (general) (routine) without abnormal findings: Secondary | ICD-10-CM | POA: Diagnosis not present

## 2021-06-15 DIAGNOSIS — Z79899 Other long term (current) drug therapy: Secondary | ICD-10-CM | POA: Diagnosis not present

## 2021-06-23 DIAGNOSIS — F411 Generalized anxiety disorder: Secondary | ICD-10-CM | POA: Diagnosis not present

## 2021-06-23 DIAGNOSIS — D696 Thrombocytopenia, unspecified: Secondary | ICD-10-CM | POA: Diagnosis not present

## 2021-06-23 DIAGNOSIS — Z0000 Encounter for general adult medical examination without abnormal findings: Secondary | ICD-10-CM | POA: Diagnosis not present

## 2021-06-23 DIAGNOSIS — E559 Vitamin D deficiency, unspecified: Secondary | ICD-10-CM | POA: Diagnosis not present

## 2021-06-23 DIAGNOSIS — Z23 Encounter for immunization: Secondary | ICD-10-CM | POA: Diagnosis not present

## 2021-06-23 DIAGNOSIS — I10 Essential (primary) hypertension: Secondary | ICD-10-CM | POA: Diagnosis not present

## 2021-06-23 DIAGNOSIS — F3341 Major depressive disorder, recurrent, in partial remission: Secondary | ICD-10-CM | POA: Diagnosis not present

## 2021-06-23 DIAGNOSIS — E039 Hypothyroidism, unspecified: Secondary | ICD-10-CM | POA: Diagnosis not present

## 2021-07-13 DIAGNOSIS — M0589 Other rheumatoid arthritis with rheumatoid factor of multiple sites: Secondary | ICD-10-CM | POA: Diagnosis not present

## 2021-07-13 DIAGNOSIS — M5442 Lumbago with sciatica, left side: Secondary | ICD-10-CM | POA: Diagnosis not present

## 2021-07-13 DIAGNOSIS — R29898 Other symptoms and signs involving the musculoskeletal system: Secondary | ICD-10-CM | POA: Diagnosis not present

## 2021-12-01 DIAGNOSIS — Z23 Encounter for immunization: Secondary | ICD-10-CM | POA: Diagnosis not present

## 2022-01-02 DIAGNOSIS — N898 Other specified noninflammatory disorders of vagina: Secondary | ICD-10-CM | POA: Diagnosis not present

## 2022-01-11 DIAGNOSIS — R29898 Other symptoms and signs involving the musculoskeletal system: Secondary | ICD-10-CM | POA: Diagnosis not present

## 2022-01-11 DIAGNOSIS — M25521 Pain in right elbow: Secondary | ICD-10-CM | POA: Diagnosis not present

## 2022-01-11 DIAGNOSIS — M5442 Lumbago with sciatica, left side: Secondary | ICD-10-CM | POA: Diagnosis not present

## 2022-01-11 DIAGNOSIS — M0589 Other rheumatoid arthritis with rheumatoid factor of multiple sites: Secondary | ICD-10-CM | POA: Diagnosis not present

## 2022-01-23 ENCOUNTER — Other Ambulatory Visit: Payer: Self-pay | Admitting: Cardiovascular Disease

## 2022-02-08 DIAGNOSIS — N898 Other specified noninflammatory disorders of vagina: Secondary | ICD-10-CM | POA: Diagnosis not present

## 2022-02-08 DIAGNOSIS — B3731 Acute candidiasis of vulva and vagina: Secondary | ICD-10-CM | POA: Diagnosis not present

## 2022-03-20 DIAGNOSIS — T148XXA Other injury of unspecified body region, initial encounter: Secondary | ICD-10-CM | POA: Diagnosis not present

## 2022-03-20 DIAGNOSIS — M545 Low back pain, unspecified: Secondary | ICD-10-CM | POA: Diagnosis not present

## 2022-03-21 IMAGING — CT CT CTA ABD/PEL W/CM AND/OR W/O CM
2 of 9 series · 12 of 46 positions shown, 17 images · IV contrast (omnipaque)
Comparison: CT dated July 24, 2004

CLINICAL DATA: Leg vein thrombosis.

EXAM:
CTA ABDOMEN AND PELVIS WITHOUT AND WITH CONTRAST
TECHNIQUE: Multidetector CT imaging of the abdomen and pelvis was performed
using the standard protocol during bolus administration of
intravenous contrast. Multiplanar reconstructed images and MIPs were
obtained and reviewed to evaluate the vascular anatomy.
CONTRAST:  100mL OMNIPAQUE IOHEXOL 350 MG/ML SOLN

[Series 6: axial venous · axial · portal-venous · 0.84mm/px · z∈[-509,-71]mm · 10 of 259 slices shown, 15 images]
[im 20/259  soft-tissue]
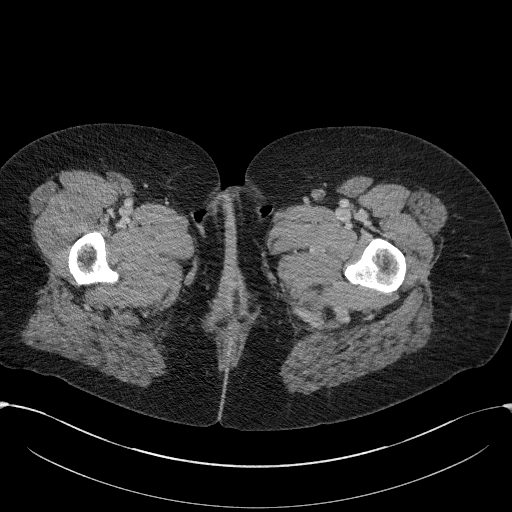
[im 20/259  bone]
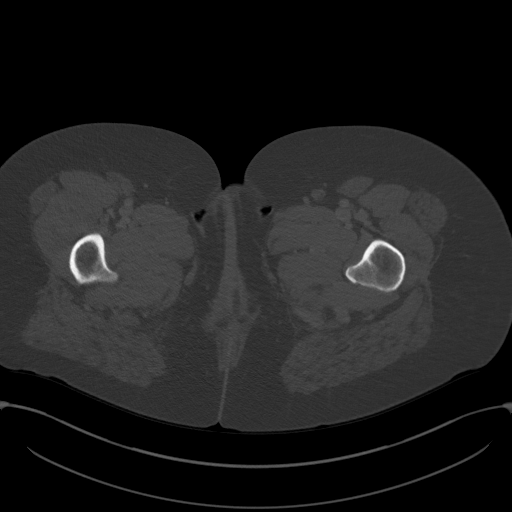
[im 60/259  soft-tissue]
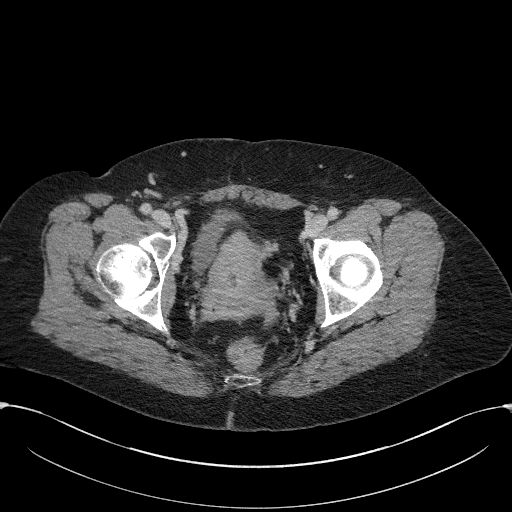
[im 80/259  soft-tissue]
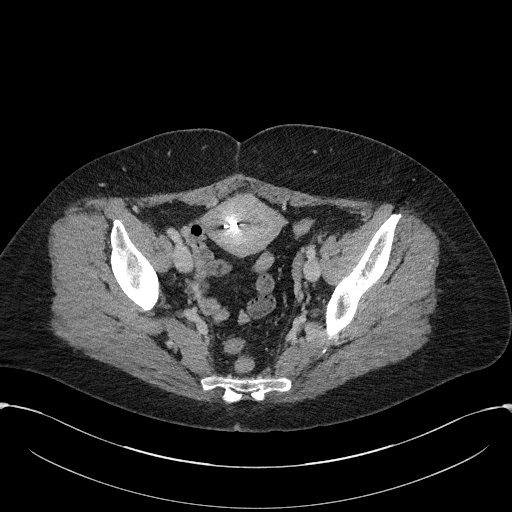
[im 100/259  soft-tissue]
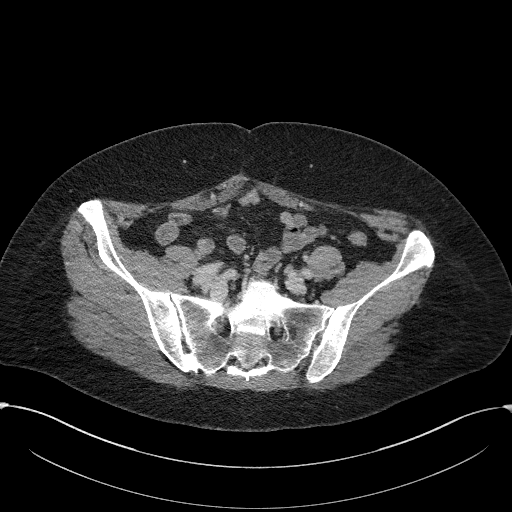
[im 139/259  soft-tissue]
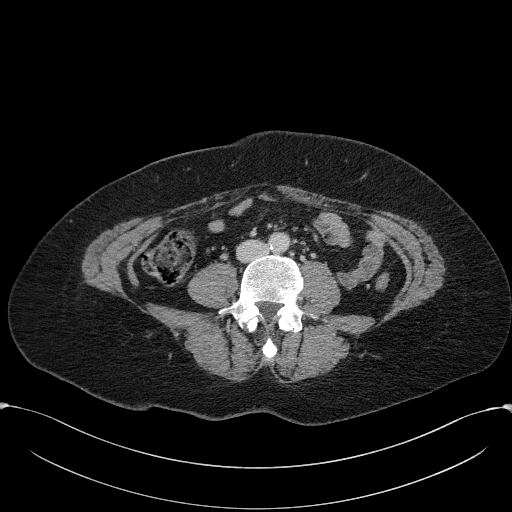
[im 159/259  soft-tissue]
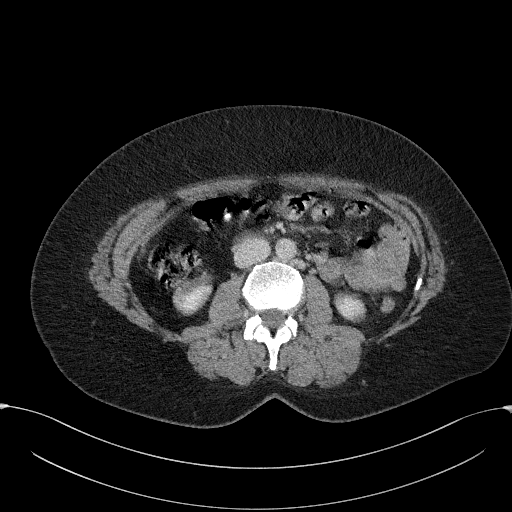
[im 179/259  soft-tissue]
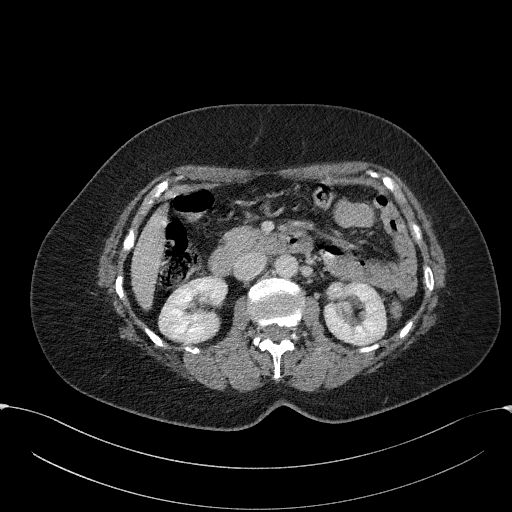
[im 179/259  lung]
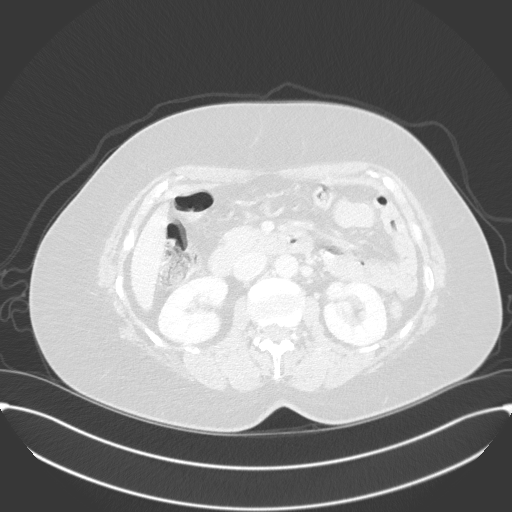
[im 199/259  lung]
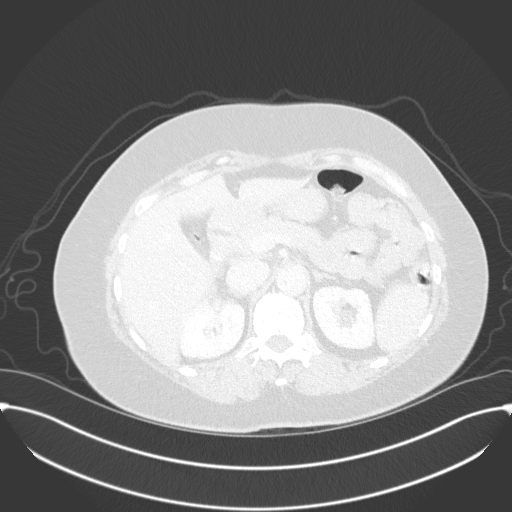
[im 219/259  soft-tissue]
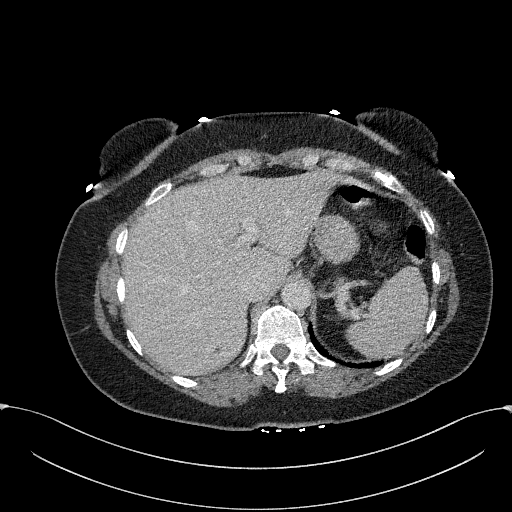
[im 219/259  lung]
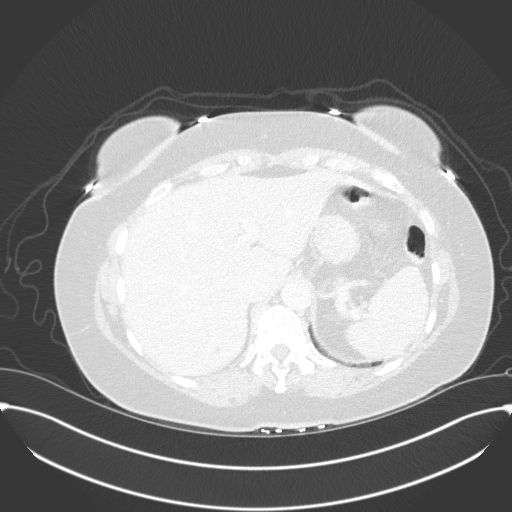
[im 239/259  soft-tissue]
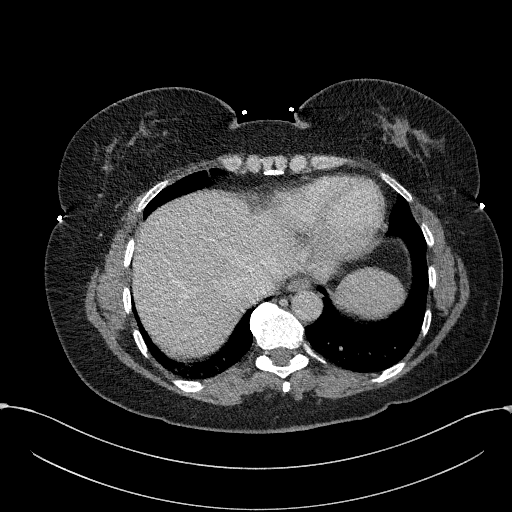
[im 239/259  lung]
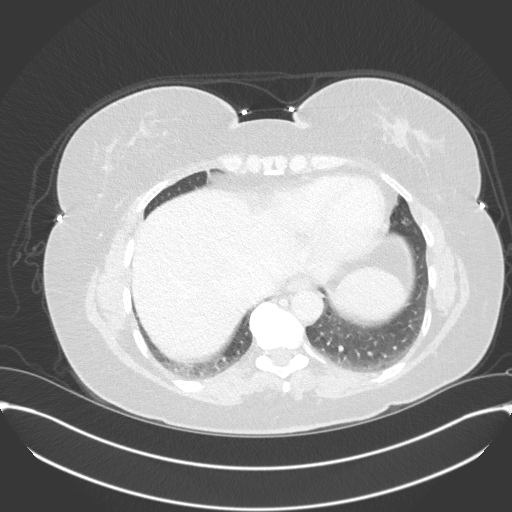
[im 239/259  bone]
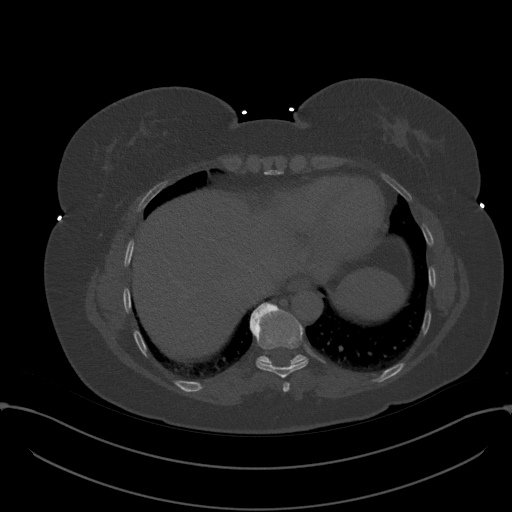

[Series 14: coronal mpr · coronal · 0.77mm/px · 2 of 140 slices shown]
[im 47/140  soft-tissue]
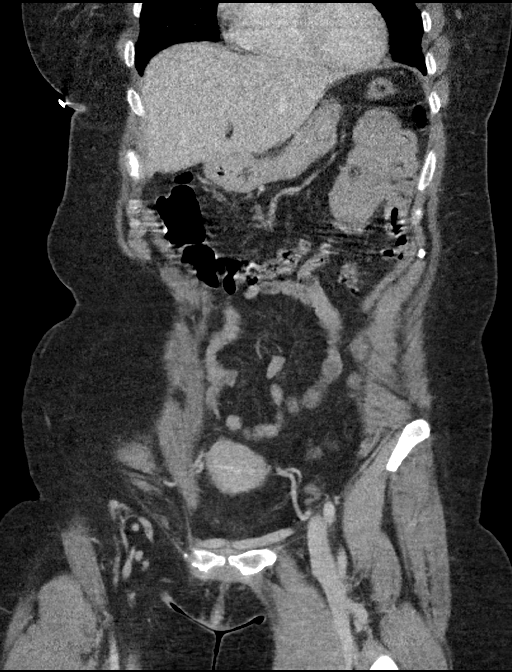
[im 93/140  soft-tissue]
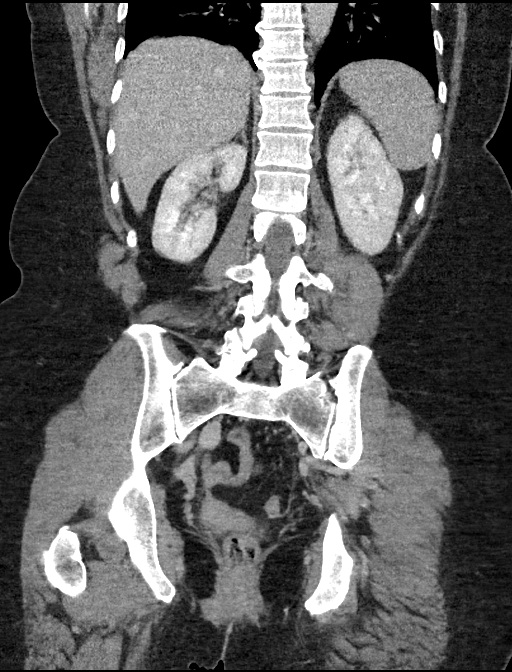

[12 of 46 positions shown; findings below may reference images not displayed]

FINDINGS: VASCULAR

Aorta: Normal caliber aorta without aneurysm, dissection, vasculitis
or significant stenosis.

Celiac: Patent without evidence of aneurysm, dissection, vasculitis
or significant stenosis.

SMA: Patent without evidence of aneurysm, dissection, vasculitis or
significant stenosis.

Renals: Both renal arteries are patent without evidence of aneurysm,
dissection, vasculitis, fibromuscular dysplasia or significant
stenosis.

IMA: Patent without evidence of aneurysm, dissection, vasculitis or
significant stenosis.

Inflow: Patent without evidence of aneurysm, dissection, vasculitis
or significant stenosis.

Proximal Outflow: Bilateral common femoral and visualized portions
of the superficial and profunda femoral arteries are patent without
evidence of aneurysm, dissection, vasculitis or significant
stenosis.

Veins: There is a left common iliac stent in place. This stent
demonstrates mild interval narrowing when compared to prior study in
1999. The stent remains patent. There is no evidence for DVT. There
are persistent prominent pelvic veins and collateral veins in the
patient's low anterior abdominal wall. Overall, this appearance is
essentially unchanged since 1999.

Review of the MIP images confirms the above findings.

NON-VASCULAR

Lower chest: There is atelectasis at the lung bases.The heart size
is normal.

Hepatobiliary: The liver is normal. Status post
cholecystectomy.There is no biliary ductal dilation.

Pancreas: Normal contours without ductal dilatation. No
peripancreatic fluid collection.

Spleen: Unremarkable.

Adrenals/Urinary Tract:

--Adrenal glands: Unremarkable.

--Right kidney/ureter: No hydronephrosis or radiopaque kidney
stones.

--Left kidney/ureter: No hydronephrosis or radiopaque kidney stones.

--Urinary bladder: Unremarkable.

Stomach/Bowel:

--Stomach/Duodenum: No hiatal hernia or other gastric abnormality.
Normal duodenal course and caliber.

--Small bowel: Unremarkable.

--Colon: Unremarkable.

--Appendix: Not visualized. No right lower quadrant inflammation or
free fluid.

Lymphatic:

--No retroperitoneal lymphadenopathy.

--No mesenteric lymphadenopathy.

--No pelvic or inguinal lymphadenopathy.

Reproductive: There is an IUD in place.

Other: No ascites or free air. The abdominal wall is normal.

Musculoskeletal. No acute displaced fractures.
IMPRESSION: 1. No DVT.
2. Again noted is a left common iliac vein stent. This stent is
patent but does demonstrate some mild interval narrowing when
compared to 1999. There are persistent essentially unchanged pelvic
varices.
3. No acute arterial abnormality.  No acute intra-abdominal process.

## 2022-07-13 DIAGNOSIS — M5442 Lumbago with sciatica, left side: Secondary | ICD-10-CM | POA: Diagnosis not present

## 2022-07-13 DIAGNOSIS — M0589 Other rheumatoid arthritis with rheumatoid factor of multiple sites: Secondary | ICD-10-CM | POA: Diagnosis not present

## 2022-07-13 DIAGNOSIS — M25521 Pain in right elbow: Secondary | ICD-10-CM | POA: Diagnosis not present

## 2022-07-13 DIAGNOSIS — R29898 Other symptoms and signs involving the musculoskeletal system: Secondary | ICD-10-CM | POA: Diagnosis not present

## 2022-07-17 DIAGNOSIS — Z124 Encounter for screening for malignant neoplasm of cervix: Secondary | ICD-10-CM | POA: Diagnosis not present

## 2022-07-17 DIAGNOSIS — Z01419 Encounter for gynecological examination (general) (routine) without abnormal findings: Secondary | ICD-10-CM | POA: Diagnosis not present

## 2022-07-17 DIAGNOSIS — E559 Vitamin D deficiency, unspecified: Secondary | ICD-10-CM | POA: Diagnosis not present

## 2022-07-17 DIAGNOSIS — Z1231 Encounter for screening mammogram for malignant neoplasm of breast: Secondary | ICD-10-CM | POA: Diagnosis not present

## 2022-07-18 DIAGNOSIS — I10 Essential (primary) hypertension: Secondary | ICD-10-CM | POA: Diagnosis not present

## 2022-07-18 DIAGNOSIS — F3341 Major depressive disorder, recurrent, in partial remission: Secondary | ICD-10-CM | POA: Diagnosis not present

## 2022-07-23 DIAGNOSIS — M25611 Stiffness of right shoulder, not elsewhere classified: Secondary | ICD-10-CM | POA: Diagnosis not present

## 2022-07-23 DIAGNOSIS — M069 Rheumatoid arthritis, unspecified: Secondary | ICD-10-CM | POA: Diagnosis not present

## 2022-08-28 ENCOUNTER — Ambulatory Visit: Payer: BC Managed Care – PPO | Attending: Cardiovascular Disease | Admitting: Cardiovascular Disease

## 2022-08-28 ENCOUNTER — Encounter: Payer: Self-pay | Admitting: Cardiovascular Disease

## 2022-08-28 VITALS — BP 108/74 | HR 72 | Ht 69.0 in | Wt 213.0 lb

## 2022-08-28 DIAGNOSIS — R2242 Localized swelling, mass and lump, left lower limb: Secondary | ICD-10-CM | POA: Diagnosis not present

## 2022-08-28 DIAGNOSIS — I10 Essential (primary) hypertension: Secondary | ICD-10-CM | POA: Diagnosis not present

## 2022-08-28 DIAGNOSIS — Z86718 Personal history of other venous thrombosis and embolism: Secondary | ICD-10-CM

## 2022-08-28 LAB — HEPATIC FUNCTION PANEL
ALT: 13 IU/L (ref 0–32)
AST: 22 IU/L (ref 0–40)
Albumin: 4.5 g/dL (ref 3.8–4.9)
Alkaline Phosphatase: 66 IU/L (ref 44–121)
Bilirubin Total: 0.3 mg/dL (ref 0.0–1.2)
Bilirubin, Direct: <0.1 mg/dL (ref 0.00–0.40)
Total Protein: 7.1 g/dL (ref 6.0–8.5)

## 2022-08-28 LAB — LIPID PANEL
Chol/HDL Ratio: 3 ratio (ref 0.0–4.4)
Cholesterol, Total: 173 mg/dL (ref 100–199)
HDL: 58 mg/dL (ref >39)
LDL Chol Calc (NIH): 99 mg/dL (ref 0–99)
Triglycerides: 86 mg/dL (ref 0–149)
VLDL Cholesterol Cal: 16 mg/dL (ref 5–40)

## 2022-08-28 MED ORDER — LISINOPRIL 10 MG PO TABS: 10.0000 mg | ORAL_TABLET | Freq: Every day | ORAL | 3 refills | Status: DC

## 2022-08-28 MED ORDER — RIVAROXABAN 10 MG PO TABS: 10.0000 mg | ORAL_TABLET | Freq: Every day | ORAL | 3 refills | Status: DC

## 2022-08-28 NOTE — Progress Notes (Signed)
08/28/2022 Cheryl Patel   1971/01/25  [id number]  Primary Physician College, [facility] [place] Primary Cardiologist: Runell Gess MD Milagros Loll, [place], [place]  HPI:  Cheryl Patel is a 52 y.o.  moderately overweight married Caucasian female with mother 3 currently does not work. She is formally a patient of Dr. Alanda Amass . I last saw her 05/30/2021.Cheryl KitchenThere is a history of remote DVT  25 years ago. 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 She does have a thrombophilia workup remarkable for a positive lupus anticoagulant. She also has a history of left common iliac vein stenting at [facility] presumably for Cheryl Patel syndrome. Her other problems include treated hypertension.. She does have chronic left lower extremity swelling and venous Dopplers performed at our office 11/16/11 did not show evidence of DVT.   She has seen Dr. Arbie Cookey who performed a CTA that showed some mild narrowing of her left common iliac vein stent.  She also had venous ablation of what sounds like a varicose vein within the last 2 years.  She wears compression stockings.   Since I saw her a year ago she is remained stable.  She walks and goes to the gym 5 days a week.  She denies chest pain or shortness of breath.  Current Meds  Medication Sig   diclofenac (CATAFLAM) 50 MG tablet Take 50 mg by mouth 3 (three) times daily as needed.   hydrochlorothiazide (MICROZIDE) 12.5 MG capsule Take 12.5 mg by mouth daily.   hydroxychloroquine (PLAQUENIL) 200 MG tablet Take 200 mg by mouth 2 (two) times daily.   methocarbamol (ROBAXIN) 750 MG tablet Take 750 mg by mouth 3 (three) times daily.   sertraline (ZOLOFT) 50 MG tablet Take 100 mg by mouth daily.   traZODone (DESYREL) 50 MG tablet Take 50 mg by mouth at bedtime as needed.   VITAMIN D, ERGOCALCIFEROL, PO Take 1 tablet by mouth daily.   [DISCONTINUED] lisinopril (ZESTRIL) 10 MG tablet TAKE 1 TABLET BY MOUTH EVERY DAY   [DISCONTINUED] rivaroxaban  (XARELTO) 10 MG TABS tablet Take 1 tablet (10 mg total) by mouth daily.     No Known Allergies  Social History   Socioeconomic History   Marital status: Married    Spouse name: Not on file   Number of children: 3   Years of education: Not on file   Highest education level: Not on file  Occupational History   Not on file  Tobacco Use   Smoking status: Never   Smokeless tobacco: Never  Vaping Use   Vaping Use: Never used  Substance and Sexual Activity   Alcohol use: No   Drug use: No   Sexual activity: Not on file  Other Topics Concern   Not on file  Social History Narrative   From [place]   Social Determinants of Health   Financial Resource Strain: Not on file  Food Insecurity: Not on file  Transportation Needs: Not on file  Physical Activity: Not on file  Stress: Not on file  Social Connections: Not on file  Intimate Partner Violence: Not on file     Review of Systems: General: negative for chills, fever, night sweats or weight changes.  Cardiovascular: negative for chest pain, dyspnea on exertion, edema, orthopnea, palpitations, paroxysmal nocturnal dyspnea or shortness of breath Dermatological: negative for rash Respiratory: negative for cough or wheezing Urologic: negative for hematuria Abdominal: negative for nausea, vomiting, diarrhea, bright red blood per rectum, melena, or hematemesis Neurologic:  negative for visual changes, syncope, or dizziness All other systems reviewed and are otherwise negative except as noted above.    Blood pressure 108/74, pulse 72, height 5\' 9"  (1.753 m), weight 213 lb (96.6 kg), SpO2 99 %.  General appearance: alert and no distress Neck: no adenopathy, no carotid bruit, no JVD, supple, symmetrical, trachea midline, and thyroid not enlarged, symmetric, no tenderness/mass/nodules Lungs: clear to auscultation bilaterally Heart: regular rate and rhythm, S1, S2 normal, no murmur, click, rub or gallop Extremities: Chronic left lower  extremity edema Pulses: 2+ and symmetric Skin: Skin color, texture, turgor normal. No rashes or lesions Neurologic: Grossly normal  EKG sinus rhythm at 72 without ST or T wave changes.  I personally reviewed this EKG.  ASSESSMENT AND PLAN:   History of DVT (deep vein thrombosis) History of recurrent DVTs with lupus anticoagulant on Xarelto oral anticoagulation.  She also has May Patel syndrome status post left common iliac artery stenting at [facility] in 2005 which has been followed by Dr. Arbie Cookey.  Essential hypertension History of essential hypertension a blood blood pressure measured today at 108/74.  She is on lisinopril and hydrochlorothiazide.  Localized swelling of left lower extremity History of chronic left lower extremity swelling status post left common iliac vein stenting for May Patel syndrome.  She has had recurrent DVTs in the past.  She does wear compression stockings.     Runell Gess MD FACP,FACC,FAHA, [facility] 08/28/2022 8:17 AM

## 2022-08-28 NOTE — Assessment & Plan Note (Signed)
History of essential hypertension a blood blood pressure measured today at 108/74.  She is on lisinopril and hydrochlorothiazide.

## 2022-08-28 NOTE — Assessment & Plan Note (Signed)
History of recurrent DVTs with lupus anticoagulant on Xarelto oral anticoagulation.  She also has May Thurner syndrome status post left common iliac artery stenting at [facility] in 2005 which has been followed by Dr. Arbie Cookey.

## 2022-08-28 NOTE — Patient Instructions (Signed)
    Follow-Up: At [facility], you and your health needs are our priority.  As part of our continuing mission to provide you with exceptional heart care, we have created designated Provider Care Teams.  These Care Teams include your primary Cardiologist (physician) and Advanced Practice Providers (APPs -  Physician Assistants and Nurse Practitioners) who all work together to provide you with the care you need, when you need it.  We recommend signing up for the patient portal called "MyChart".  Sign up information is provided on this After Visit Summary.  MyChart is used to connect with patients for Virtual Visits (Telemedicine).  Patients are able to view lab/test results, encounter notes, upcoming appointments, etc.  Non-urgent messages can be sent to your provider as well.   To learn more about what you can do with MyChart, go to [email or web address].    Your next appointment:   12 month(s)  Provider:   Jonathan Berry, MD      

## 2022-08-28 NOTE — Assessment & Plan Note (Signed)
History of chronic left lower extremity swelling status post left common iliac vein stenting for May Thurner syndrome.  She has had recurrent DVTs in the past.  She does wear compression stockings.

## 2022-09-21 DIAGNOSIS — H43811 Vitreous degeneration, right eye: Secondary | ICD-10-CM | POA: Diagnosis not present

## 2022-12-07 DIAGNOSIS — I10 Essential (primary) hypertension: Secondary | ICD-10-CM | POA: Diagnosis not present

## 2022-12-07 DIAGNOSIS — B351 Tinea unguium: Secondary | ICD-10-CM | POA: Diagnosis not present

## 2022-12-07 DIAGNOSIS — E039 Hypothyroidism, unspecified: Secondary | ICD-10-CM | POA: Diagnosis not present

## 2022-12-07 DIAGNOSIS — H6123 Impacted cerumen, bilateral: Secondary | ICD-10-CM | POA: Diagnosis not present

## 2022-12-07 DIAGNOSIS — F3341 Major depressive disorder, recurrent, in partial remission: Secondary | ICD-10-CM | POA: Diagnosis not present

## 2023-01-09 DIAGNOSIS — F5101 Primary insomnia: Secondary | ICD-10-CM | POA: Diagnosis not present

## 2023-01-09 DIAGNOSIS — M25511 Pain in right shoulder: Secondary | ICD-10-CM | POA: Diagnosis not present

## 2023-01-11 DIAGNOSIS — M5442 Lumbago with sciatica, left side: Secondary | ICD-10-CM | POA: Diagnosis not present

## 2023-01-11 DIAGNOSIS — M0589 Other rheumatoid arthritis with rheumatoid factor of multiple sites: Secondary | ICD-10-CM | POA: Diagnosis not present

## 2023-01-11 DIAGNOSIS — M25521 Pain in right elbow: Secondary | ICD-10-CM | POA: Diagnosis not present

## 2023-01-23 DIAGNOSIS — E559 Vitamin D deficiency, unspecified: Secondary | ICD-10-CM | POA: Diagnosis not present

## 2023-01-23 DIAGNOSIS — F3341 Major depressive disorder, recurrent, in partial remission: Secondary | ICD-10-CM | POA: Diagnosis not present

## 2023-01-23 DIAGNOSIS — E039 Hypothyroidism, unspecified: Secondary | ICD-10-CM | POA: Diagnosis not present

## 2023-01-23 DIAGNOSIS — M1990 Unspecified osteoarthritis, unspecified site: Secondary | ICD-10-CM | POA: Diagnosis not present

## 2023-01-23 DIAGNOSIS — B351 Tinea unguium: Secondary | ICD-10-CM | POA: Diagnosis not present

## 2023-01-23 DIAGNOSIS — Z0000 Encounter for general adult medical examination without abnormal findings: Secondary | ICD-10-CM | POA: Diagnosis not present

## 2023-01-23 DIAGNOSIS — I10 Essential (primary) hypertension: Secondary | ICD-10-CM | POA: Diagnosis not present

## 2023-01-23 DIAGNOSIS — E782 Mixed hyperlipidemia: Secondary | ICD-10-CM | POA: Diagnosis not present

## 2023-01-23 DIAGNOSIS — F5101 Primary insomnia: Secondary | ICD-10-CM | POA: Diagnosis not present

## 2023-08-12 ENCOUNTER — Ambulatory Visit: Admitting: Cardiovascular Disease

## 2023-09-02 ENCOUNTER — Ambulatory Visit: Attending: Cardiovascular Disease | Admitting: Cardiovascular Disease

## 2023-10-25 ENCOUNTER — Other Ambulatory Visit: Payer: Self-pay | Admitting: Cardiovascular Disease

## 2023-10-25 DIAGNOSIS — I10 Essential (primary) hypertension: Secondary | ICD-10-CM

## 2023-10-28 NOTE — Telephone Encounter (Signed)
 Prescription refill request for Xarelto  received.  Indication:dvt Last office visit:needs appt Weight:96.6  kg Age:53 Scr:na CrCl:na  Prescription refilled

## 2023-11-26 ENCOUNTER — Other Ambulatory Visit: Payer: Self-pay | Admitting: Cardiovascular Disease

## 2023-11-26 DIAGNOSIS — I10 Essential (primary) hypertension: Secondary | ICD-10-CM

## 2023-12-01 ENCOUNTER — Other Ambulatory Visit: Payer: Self-pay | Admitting: Cardiovascular Disease

## 2023-12-09 ENCOUNTER — Other Ambulatory Visit: Payer: Self-pay | Admitting: Cardiovascular Disease

## 2023-12-09 DIAGNOSIS — I10 Essential (primary) hypertension: Secondary | ICD-10-CM

## 2023-12-21 ENCOUNTER — Other Ambulatory Visit: Payer: Self-pay | Admitting: Cardiovascular Disease

## 2023-12-21 DIAGNOSIS — I10 Essential (primary) hypertension: Secondary | ICD-10-CM

## 2023-12-23 ENCOUNTER — Ambulatory Visit: Attending: Cardiovascular Disease | Admitting: Cardiovascular Disease

## 2023-12-23 ENCOUNTER — Encounter: Payer: Self-pay | Admitting: Cardiovascular Disease

## 2023-12-23 VITALS — BP 140/78 | HR 70 | Ht 68.0 in | Wt 225.0 lb

## 2023-12-23 DIAGNOSIS — I10 Essential (primary) hypertension: Secondary | ICD-10-CM

## 2023-12-23 NOTE — Assessment & Plan Note (Signed)
 History of essential hypertension blood pressure measured today at 140/78.  She is on lisinopril  and hydrochlorothiazide .

## 2023-12-23 NOTE — Progress Notes (Signed)
 12/23/2023 Cheryl Patel   [birth year]  [id number]  Primary Physician College, [facility] [place] Primary Cardiologist: Dorn Cheryl Lesches MD [name], [place], [place]  HPI:  Cheryl Patel is a 53 y.o.  moderately overweight married Caucasian female with mother 3 currently does not work. She is formally a patient of Dr. Maye . I last saw her 6//24..There is a history of remote DVT  25 years ago. 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 She does have a thrombophilia workup remarkable for a positive lupus anticoagulant. She also has a history of left common iliac vein stenting at [facility] presumably for [name] syndrome. Her other problems include treated hypertension.. She does have chronic left lower extremity swelling and venous Dopplers performed at our office 11/16/11 did not show evidence of DVT.   She has seen Dr. Oris who performed a CTA that showed some mild narrowing of her left common iliac vein stent.  She also had venous ablation of what sounds like a varicose vein within the last 2 years.  She wears compression stockings.    Since I saw her a year ago she is remained stable.  She walks and goes to the gym 5 days a week.  She denies chest pain or shortness of breath.  She has chronic left lower extremity swelling.   Current Meds  Medication Sig   Cholecalciferol 50 MCG (2000 UT) CAPS 1 capsule.   diclofenac (CATAFLAM) 50 MG tablet Take 50 mg by mouth 3 (three) times daily as needed.   hydroxychloroquine (PLAQUENIL) 200 MG tablet Take 200 mg by mouth 2 (two) times daily.   methocarbamol (ROBAXIN) 750 MG tablet Take 750 mg by mouth 3 (three) times daily.   rivaroxaban  (XARELTO ) 10 MG TABS tablet TAKE 1 TABLET BY MOUTH EVERY DAY   sertraline (ZOLOFT) 50 MG tablet Take 100 mg by mouth daily.   traZODone (DESYREL) 50 MG tablet Take 50 mg by mouth at bedtime as needed.   VITAMIN D, ERGOCALCIFEROL, PO Take 1 tablet by mouth daily.   [DISCONTINUED]  lisinopril  (ZESTRIL ) 10 MG tablet TAKE 1 TABLET BY MOUTH EVERY DAY     No Known Allergies  Social History   Socioeconomic History   Marital status: Married    Spouse name: Not on file   Number of children: 3   Years of education: Not on file   Highest education level: Not on file  Occupational History   Not on file  Tobacco Use   Smoking status: Never   Smokeless tobacco: Never  Vaping Use   Vaping status: Never Used  Substance and Sexual Activity   Alcohol use: No   Drug use: No   Sexual activity: Not on file  Other Topics Concern   Not on file  Social History Narrative   From [place]   Social Drivers of Health   Financial Resource Strain: Not on file  Food Insecurity: Not on file  Transportation Needs: Not on file  Physical Activity: Not on file  Stress: Not on file  Social Connections: Not on file  Intimate Partner Violence: Not on file     Review of Systems: General: negative for chills, fever, night sweats or weight changes.  Cardiovascular: negative for chest pain, dyspnea on exertion, edema, orthopnea, palpitations, paroxysmal nocturnal dyspnea or shortness of breath Dermatological: negative for rash Respiratory: negative for cough or wheezing Urologic: negative for hematuria Abdominal: negative for nausea, vomiting, diarrhea, bright red blood per rectum,  melena, or hematemesis Neurologic: negative for visual changes, syncope, or dizziness All other systems reviewed and are otherwise negative except as noted above.    Blood pressure (!) 140/78, pulse 70, height 5' 8 (1.727 m), weight 225 lb (102.1 kg), SpO2 99%.  General appearance: alert and no distress Neck: no adenopathy, no carotid bruit, no JVD, supple, symmetrical, trachea midline, and thyroid  not enlarged, symmetric, no tenderness/mass/nodules Lungs: clear to auscultation bilaterally Heart: regular rate and rhythm, S1, S2 normal, no murmur, click, rub or gallop Extremities: 1+ left lower  extremity edema. Pulses: 2+ and symmetric Skin: Skin color, texture, turgor normal. No rashes or lesions Neurologic: Grossly normal  EKG EKG Interpretation Date/Time:  Monday December 23 2023 10:43:50 EDT Ventricular Rate:  70 PR Interval:  158 QRS Duration:  84 QT Interval:  434 QTC Calculation: 468 R Axis:   57  Text Interpretation: Normal sinus rhythm Normal ECG No previous ECGs available Confirmed by Court Carrier [phone]) on 12/23/2023 10:54:57 AM    ASSESSMENT AND PLAN:   Essential hypertension History of essential hypertension blood pressure measured today at 140/78.  She is on lisinopril  and hydrochlorothiazide .     Carrier DOROTHA Court MD FACP,FACC,FAHA, [facility] 12/23/2023 11:00 AM

## 2023-12-23 NOTE — Patient Instructions (Signed)

## 2023-12-31 ENCOUNTER — Telehealth: Payer: Self-pay | Admitting: *Deleted

## 2023-12-31 NOTE — Telephone Encounter (Signed)
 Received call from provider from [facility] in [place] requesting urgent referral for pt.  Hx of recurrent DVT while on Xarelto  10 mg p.o daily.  Appt scheduled, pt notified and verbalized understanding.  RN requested referral be faxed directly to our office, provider verbalized understanding stating he will fax records.

## 2024-01-07 ENCOUNTER — Other Ambulatory Visit

## 2024-01-07 ENCOUNTER — Inpatient Hospital Stay: Attending: Hematology and Oncology | Admitting: Hematology and Oncology

## 2024-01-07 VITALS — BP 133/91 | HR 83 | Temp 98.3°F | Resp 18 | Wt 224.3 lb

## 2024-01-07 DIAGNOSIS — Z8249 Family history of ischemic heart disease and other diseases of the circulatory system: Secondary | ICD-10-CM | POA: Diagnosis not present

## 2024-01-07 DIAGNOSIS — D6862 Lupus anticoagulant syndrome: Secondary | ICD-10-CM | POA: Diagnosis not present

## 2024-01-07 DIAGNOSIS — I10 Essential (primary) hypertension: Secondary | ICD-10-CM | POA: Insufficient documentation

## 2024-01-07 DIAGNOSIS — Z79899 Other long term (current) drug therapy: Secondary | ICD-10-CM | POA: Diagnosis not present

## 2024-01-07 DIAGNOSIS — Z86718 Personal history of other venous thrombosis and embolism: Secondary | ICD-10-CM | POA: Diagnosis present

## 2024-01-07 DIAGNOSIS — Z800 Family history of malignant neoplasm of digestive organs: Secondary | ICD-10-CM | POA: Diagnosis not present

## 2024-01-07 DIAGNOSIS — Z7901 Long term (current) use of anticoagulants: Secondary | ICD-10-CM | POA: Insufficient documentation

## 2024-01-07 MED ORDER — RIVAROXABAN 20 MG PO TABS: 20.0000 mg | ORAL_TABLET | Freq: Every day | ORAL | 3 refills | Status: AC

## 2024-01-07 NOTE — Progress Notes (Signed)
 [facility] CONSULT NOTE  Patient Care Team: [facility], [facility] [place] as PCP - General (Family Medicine) Court Dorn PARAS, MD as PCP - Cardiology (Cardiology)  CHIEF COMPLAINTS/PURPOSE OF CONSULTATION:  Blood clots on Xarelto  10 mg  HISTORY OF PRESENTING ILLNESS:  History of Present Illness Cheryl Patel is a 53 year old female with chronic deep vein thrombosis who presents for evaluation and management of anticoagulation therapy. She was referred by a vein specialist for evaluation of her chronic DVT and anticoagulation management.  Iliac vein stenting was performed in 2005 due to compression or narrowing, initially prompted by a blood clot after her first pregnancy at age 29. She has been on anticoagulation therapy since then, initially on 20 mg of Xarelto , reduced to 10 mg in the last four years.  A recent ultrasound shows chronic blood clots in the femoral and popliteal veins, as well as in the left gastrocnemius. She experiences leg swelling and pain, particularly in the foot, which has improved but was initially severe enough to require ice application every four hours.  She recently discontinued Plaquenil (hydroxychloroquine) due to vision problems, which coincided with increased leg pain. She has been off the medication for three weeks and is experiencing increased joint pain, particularly in the fingers, upon waking.  No family history of blood clots is reported, and she has not experienced any bleeding issues while on blood thinners. She is currently taking 10 mg of Xarelto  daily.    I reviewed her records extensively and collaborated the history with the patient.   MEDICAL HISTORY:  Past Medical History:  Diagnosis Date   DVT (deep venous thrombosis) (HCC)    History of DVT (deep vein thrombosis)    LLE - xarelto , history of left iliac vein stenting at [facility]   Hypercoagulable state    positive lupus anticoagulant   Hypertension      SURGICAL HISTORY: Past Surgical History:  Procedure Laterality Date   CESAREAN SECTION  04/1999   x2   CHOLECYSTECTOMY     TRANSTHORACIC ECHOCARDIOGRAM  03/31/2012   EF 50-55%, normal systolucn function   VEIN SURGERY  2005   stent in LE vein at [facility]    SOCIAL HISTORY: Social History   Socioeconomic History   Marital status: Married    Spouse name: Not on file   Number of children: 3   Years of education: Not on file   Highest education level: Not on file  Occupational History   Not on file  Tobacco Use   Smoking status: Never   Smokeless tobacco: Never  Vaping Use   Vaping status: Never Used  Substance and Sexual Activity   Alcohol use: No   Drug use: No   Sexual activity: Not on file  Other Topics Concern   Not on file  Social History Narrative   From [place]   Social Drivers of Health   Financial Resource Strain: Not on file  Food Insecurity: No Food Insecurity (01/07/2024)   Hunger Vital Sign    Worried About Running Out of Food in the Last Year: Never true    Ran Out of Food in the Last Year: Never true  Transportation Needs: No Transportation Needs (01/07/2024)   PRAPARE - Administrator, Civil Service (Medical): No    Lack of Transportation (Non-Medical): No  Physical Activity: Not on file  Stress: Not on file  Social Connections: Not on file  Intimate Partner Violence: Not At Risk (  01/07/2024)   Humiliation, Afraid, Rape, and Kick questionnaire    Fear of Current or Ex-Partner: No    Emotionally Abused: No    Physically Abused: No    Sexually Abused: No    FAMILY HISTORY: Family History  Problem Relation Age of Onset   Hypertension Mother    Pancreatic cancer Brother     ALLERGIES:  has no known allergies.  MEDICATIONS:  Current Outpatient Medications  Medication Sig Dispense Refill   Cholecalciferol 50 MCG (2000 UT) CAPS 1 capsule.     diclofenac (CATAFLAM) 50 MG tablet Take 50 mg by mouth 3 (three) times daily as  needed.     hydrochlorothiazide  (MICROZIDE ) 12.5 MG capsule Take 12.5 mg by mouth daily.     lisinopril  (ZESTRIL ) 10 MG tablet TAKE 1 TABLET BY MOUTH EVERY DAY 90 tablet 3   methocarbamol (ROBAXIN) 750 MG tablet Take 750 mg by mouth 3 (three) times daily.     rivaroxaban  (XARELTO ) 20 MG TABS tablet Take 1 tablet (20 mg total) by mouth daily with supper. 90 tablet 3   sertraline (ZOLOFT) 50 MG tablet Take 100 mg by mouth daily.     traZODone (DESYREL) 50 MG tablet Take 50 mg by mouth at bedtime as needed.     VITAMIN D, ERGOCALCIFEROL, PO Take 1 tablet by mouth daily.     No current facility-administered medications for this visit.    REVIEW OF SYSTEMS:   Constitutional: Denies fevers, chills or abnormal night sweats All other systems were reviewed with the patient and are negative.  PHYSICAL EXAMINATION: ECOG PERFORMANCE STATUS: 1 - Symptomatic but completely ambulatory  Vitals:   01/07/24 1451  BP: (!) 133/91  Pulse: 83  Resp: 18  Temp: 98.3 F (36.8 C)  SpO2: 99%   Filed Weights   01/07/24 1451  Weight: 224 lb 4.8 oz (101.7 kg)    GENERAL:alert, no distress and comfortable  LABORATORY DATA:  I have reviewed the data as listed Lab Results  Component Value Date   WBC 10.7 05/30/2021   HGB 13.0 05/30/2021   HCT 39.3 05/30/2021   MCV 77 (L) 05/30/2021   PLT 405 05/30/2021   Lab Results  Component Value Date   NA 138 05/30/2021   K 4.7 05/30/2021   CL 99 05/30/2021   CO2 21 05/30/2021    RADIOGRAPHIC STUDIES: I have personally reviewed the radiological reports and agreed with the findings in the report.  ASSESSMENT AND PLAN:  History of DVT (deep vein thrombosis) History of recurrent left lower extremity DVT. 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  I will be forwarding this note to Dr. Dorn Dames Patient can be seen on an as-needed basis.  I sent a years worth of refills on Xarelto  20 mg dosage.    All questions were answered. The patient knows to call the clinic with any problems, questions or concerns.  I personally spent a total of 60 minutes in the care of the patient today including preparing to see the patient, getting/reviewing separately obtained history, performing a medically appropriate exam/evaluation, counseling and  educating, placing orders, referring and communicating with other health care professionals, documenting clinical information in the EHR, independently interpreting results, communicating results, and coordinating care.    Viinay K Miguelangel Korn, MD 01/07/24

## 2024-01-07 NOTE — Assessment & Plan Note (Signed)
 History of recurrent left lower extremity DVT. 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

## 2024-02-05 NOTE — Progress Notes (Signed)
 Office Visit Note  Patient: Cheryl Patel             Date of Birth: 07-23-1970           MRN: [id number]             PCP: [facility] [place] Referring: [facility] Visit Date: 02/19/2024 Occupation: Data Unavailable  Interpreter: [name]  Subjective:  Pain in multiple joints  History of Present Illness: Cheryl Patel is a 53 y.o. female  diagnosed by Dr. Mai in 2014 with rheumatoid arthritis based on polyarthralgia.  She presented with pain in her hands, elbows, knees and her hips.  At the time her ANA was positive and ultrasound obtained by Dr. Mai showed possible inflammatory arthritis.  Patient had intermittent visits.  She was seen in 2020 then lost follow-up then again seen in  2022.  States that she has been seeing her PCP on a regular basis and had been getting Plaquenil on a regular basis since 2014.  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  She has been off Plaquenil since September 2025.  She states she has been having increased joint pain and discomfort since she has been off of Plaquenil.  She complains of discomfort in her right shoulder, bilateral elbows, bilateral hands, knees and her feet.  She reports swelling in her right elbow.  None of the other joints are swelling. There is no family history of rheumatoid arthritis or autoimmune disease.  She is right-handed, unemployed.  She is married, gravida 3, para 3.  There is no history of preeclampsia.  She states she had DVT during pregnancy.  There is no history of alcoholism or smoking.  Patient was accompanied by her [name] today.   Activities of Daily Living:  Patient reports morning stiffness for 1 hour.   Patient Reports nocturnal pain.  Difficulty dressing/grooming: Denies Difficulty climbing stairs: Reports Difficulty getting out of chair:  Reports Difficulty using hands for taps, buttons, cutlery, and/or writing: Denies  Review of Systems  Constitutional:  Positive for fatigue.  HENT:  Negative for mouth sores and mouth dryness.   Eyes:  Negative for dryness.  Respiratory:  Negative for shortness of breath.   Cardiovascular:  Negative for chest pain and palpitations.  Gastrointestinal:  Negative for blood in stool, constipation and diarrhea.  Endocrine: Negative for increased urination.  Genitourinary:  Negative for involuntary urination.  Musculoskeletal:  Positive for joint pain, gait problem, joint pain, joint swelling, myalgias, muscle weakness, morning stiffness and myalgias. Negative for muscle tenderness.  Skin:  Positive for hair loss. Negative for color change, rash and sensitivity to sunlight.  Allergic/Immunologic: Negative for susceptible to infections.  Neurological:  Negative for dizziness and headaches.  Hematological:  Negative for swollen glands.  Psychiatric/Behavioral:  Positive for depressed mood. Negative for sleep disturbance. The patient is nervous/anxious.     PMFS History:  Patient Active Problem List   Diagnosis Date Noted   Localized swelling of left lower extremity 04/15/2019   Lupus anticoagulant positive 06/24/2017   Chronic anticoagulation 06/24/2017   Onychomycosis 10/23/2016   History of DVT (deep vein thrombosis) 01/28/2013   Essential hypertension 01/28/2013    Past Medical History:  Diagnosis Date   DVT (deep venous thrombosis) (HCC)    History of DVT (deep vein thrombosis)    LLE - xarelto , history of left  iliac vein stenting at [facility]   Hypercoagulable state    positive lupus anticoagulant   Hypertension     Family History  Problem Relation Age of Onset   Hypertension Mother    Breast cancer Sister    Pancreatic cancer Brother    Healthy Son    Healthy Son    Healthy Daughter    Past Surgical History:  Procedure Laterality Date   CESAREAN SECTION  04/1999   x2    CHOLECYSTECTOMY     TRANSTHORACIC ECHOCARDIOGRAM  03/31/2012   EF 50-55%, normal systolucn function   VEIN SURGERY  2005   stent in LE vein at [facility]   Social History   Tobacco Use   Smoking status: Never    Passive exposure: Never   Smokeless tobacco: Never  Vaping Use   Vaping status: Never Used  Substance Use Topics   Alcohol use: No   Drug use: No   Social History   Social History Narrative   From [place]     Immunization History  Administered Date(s) Administered   Influenza Inj Mdck Quad With Preservative 02/20/2019   Moderna Sars-Covid-2 Vaccination 06/02/2019     Objective: Vital Signs: BP 133/87 (BP Location: Right Arm, Patient Position: Sitting, Cuff Size: Large)   Pulse 76   Temp 98 F (36.7 C)   Resp 17   Ht 5' 8 (1.727 m)   Wt 227 lb 6.4 oz (103.1 kg)   BMI 34.58 kg/m    Physical Exam Vitals and nursing note reviewed.  Constitutional:      Appearance: She is well-developed.  HENT:     Head: Normocephalic and atraumatic.  Eyes:     Conjunctiva/sclera: Conjunctivae normal.  Cardiovascular:     Rate and Rhythm: Normal rate and regular rhythm.     Heart sounds: Normal heart sounds.  Pulmonary:     Effort: Pulmonary effort is normal.     Breath sounds: Normal breath sounds.  Abdominal:     General: Bowel sounds are normal.     Palpations: Abdomen is soft.  Musculoskeletal:     Cervical back: Normal range of motion.  Lymphadenopathy:     Cervical: No cervical adenopathy.  Skin:    General: Skin is warm and dry.     Capillary Refill: Capillary refill takes less than 2 seconds.  Neurological:     Mental Status: She is alert and oriented to person, place, and time.  Psychiatric:        Behavior: Behavior normal.      Musculoskeletal Exam: Cervical, thoracic and lumbar spine were in good range of motion.  There was no SI joint tenderness.  Shoulder joints, elbow joints, wrist joints, MCPs, PIPs and DIPs were in good range of motion  with no synovitis.  Left olecranon bursitis was noted.  PIP and DIP thickening was noted.  Hip joints and knee joints were in good range of motion without any warmth swelling or effusion.  There was no tenderness over ankles or MTPs.   CDAI Exam: CDAI Score: 4  Patient Global: 20 / 100; Provider Global: 20 / 100 Swollen: 0 ; Tender: 2  Joint Exam 02/19/2024      Right  Left  Ankle      Tender  MTP 5   Tender        Investigation: No additional findings.  Imaging: XR Foot 2 Views Left Result Date: 02/19/2024 First MTP, PIP and DIP narrowing was noted.  No tarsal,  tibiotalar or subtalar joint space narrowing was noted.  Inferior and posterior calcaneal spurs were noted. Impression: These findings history of osteoarthritis of the foot.  XR Foot 2 Views Right Result Date: 02/19/2024 First MTP, PIP and DIP narrowing was noted.  No tarsal, tibiotalar or subtalar joint space narrowing was noted.  Inferior and posterior calcaneal spurs were noted. Impression: These findings history of osteoarthritis of the foot.  XR Hand 2 View Left Result Date: 02/19/2024 No MCP, PIP, DIP, intercarpal or radiocarpal joint space narrowing was noted.  No erosive changes were noted. Impression: Unremarkable x-rays of the hand.  XR Hand 2 View Right Result Date: 02/19/2024 No MCP, PIP, DIP, intercarpal or radiocarpal joint space narrowing was noted.  No erosive changes were noted. Impression: Unremarkable x-rays of the hand.   Recent Labs: Lab Results  Component Value Date   WBC 10.7 05/30/2021   HGB 13.0 05/30/2021   PLT 405 05/30/2021   NA 138 05/30/2021   K 4.7 05/30/2021   CL 99 05/30/2021   CO2 21 05/30/2021   GLUCOSE 92 05/30/2021   BUN 13 05/30/2021   CREATININE 0.76 05/30/2021   BILITOT 0.3 08/28/2022   ALKPHOS 66 08/28/2022   AST 22 08/28/2022   ALT 13 08/28/2022   PROT 7.1 08/28/2022   ALBUMIN 4.5 08/28/2022   CALCIUM 9.7 05/30/2021   GFRAA >89 06/12/2016   2014 sed rate  normal, CRP normal, RF negative, MCV negative, hepatitis B&C nonreactive, CBC and CMP normal.  ANA positive, double-stranded DNA 14, anti-CCP 32. January 13, 2021 WBC 6.7, hemoglobin 10.4, platelets 444, CMP AST 11, ALT 18, creatinine 0.78, GFR 91   Speciality Comments: No specialty comments available.  Procedures:  No procedures performed Allergies: Patient has no known allergies.   Assessment / Plan:     Visit Diagnoses: Rheumatoid arthritis with rheumatoid factor of multiple sites without organ or systems involvement [facility]) -patient was diagnosed by Dr. Mai in 2014.  She was on Plaquenil for several years until 2 months ago when she discontinued Plaquenil due to ocular toxicity.  She gives history of some joint pain and stiffness.  She has noticed left olecranon bursitis.  None of the other joints are swollen.  Plan: Rheumatoid factor, Cyclic citrul peptide antibody, IgG, Sedimentation rate, DG Chest 2 View.  Different treatment options and the side effects were discussed at length.  After reviewing indications side effects contraindications decided to proceed with methotrexate.  A handout was given and consent was taken.  Once the lab results available we will start her on methotrexate 6 tablets p.o. weekly along with folic acid 1 mg p.o. daily and continue on the current dose.  Drug Counseling TB Gold: Pending Hepatitis panel: Pending  Chest-xray: Pending  Contraception: IUD  Alcohol use: None  Patient was counseled on the purpose, proper use, and adverse effects of methotrexate including nausea, infection, and signs and symptoms of pneumonitis.  Reviewed instructions with patient to take methotrexate weekly along with folic acid daily.  Discussed the importance of frequent monitoring of kidney and liver function and blood counts, and provided patient with standing lab instructions.  Counseled patient to avoid NSAIDs and alcohol while on methotrexate.  Provided patient with educational  materials on methotrexate and answered all questions.  Advised patient to get annual influenza vaccine and to get a pneumococcal vaccine if patient has not already had one.  Patient voiced understanding.  Patient consented to methotrexate use.  Will upload into chart.  High risk medication use - Plaquenil since December 19, 2012 discontinued in September 2025.- Plan: CBC with Differential/Platelet, Comprehensive metabolic panel with GFR, Hepatitis B core antibody, IgM, Hepatitis B surface antigen, Hepatitis C antibody, QuantiFERON-TB Gold Plus, Serum protein electrophoresis with reflex, IgG, IgA, IgM, Thiopurine methyltransferase(tpmt)rbc, DG Chest 2 View  Pain in both hands- she complains of pain and stiffness in both hands.  No synovitis was noted on the examination.  Bilateral PIP and DIP thickening was noted.- Plan: XR Hand 2 View Right, XR Hand 2 View Left  Olecranon bursitis of left elbow-left olecranon bursitis was noted.  Pain in both feet -she complains of pain in bilateral feet especially over MTP joints and base of her right fifth MTP joint.  No synovitis was noted.  Plan: XR Foot 2 Views Right, XR Foot 2 Views Left  Chronic midline low back pain without sciatica-she has history of intermittent discomfort in her lower back.  Positive ANA (antinuclear antibody) -she was found to have positive ANA and positive double-stranded DNA in the past.  She also had lupus anticoagulant.  I will obtain complete ENA panel today.  Plan: Protein / creatinine ratio, urine, ANA, Anti-scleroderma antibody, RNP Antibody, Anti-Smith antibody, Sjogrens syndrome-A extractable nuclear antibody, Sjogrens syndrome-B extractable nuclear antibody, Anti-DNA antibody, double-stranded, C3 and C4, Beta-2 glycoprotein antibodies, Cardiolipin antibodies, IgG, IgM, IgA, Lupus Anticoagulant Eval w/Reflex  Lupus anticoagulant positive  History of DVT (deep vein thrombosis) - Stenosis of left common iliac vein, under care  of Dr. Wadie.  History of DVT during pregnancy per patient.  Chronic anticoagulation - Xarelto  20 mg p.o. daily.  Thrombocytopenia-I do not have recent platelet count.  Essential hypertension-blood pressure was normal at 133/87.  She is on HCTZ and lisinopril .  Mixed hyperlipidemia  Gastroesophageal reflux disease without esophagitis  Primary insomnia  Vitamin D deficiency  Anxiety and depression  Language barrier-she speaks fairly good English.  She was accompanied by her [name] who acted as interpreter through the visit.  Orders: Orders Placed This Encounter  Procedures   DG Chest 2 View   XR Hand 2 View Right   XR Hand 2 View Left   XR Foot 2 Views Right   XR Foot 2 Views Left   CBC with Differential/Platelet   Comprehensive metabolic panel with GFR   Protein / creatinine ratio, urine   ANA   Anti-scleroderma antibody   RNP Antibody   Anti-Smith antibody   Sjogrens syndrome-A extractable nuclear antibody   Sjogrens syndrome-B extractable nuclear antibody   Anti-DNA antibody, double-stranded   C3 and C4   Beta-2 glycoprotein antibodies   Cardiolipin antibodies, IgG, IgM, IgA   Lupus Anticoagulant Eval w/Reflex   Rheumatoid factor   Cyclic citrul peptide antibody, IgG   Sedimentation rate   Hepatitis B core antibody, IgM   Hepatitis B surface antigen   Hepatitis C antibody   QuantiFERON-TB Gold Plus   Serum protein electrophoresis with reflex   IgG, IgA, IgM   Thiopurine methyltransferase(tpmt)rbc   No orders of the defined types were placed in this encounter.   Face-to-face time spent with patient was over 60 minutes. Greater than 50% of time was spent in counseling and coordination of care.  Follow-Up Instructions: Return for Rheumatoid arthritis.   Maya Nash, MD  Note - This record has been created using Animal nutritionist.  Chart creation errors have been sought, but may not always  have been located. Such creation errors do not reflect on   the standard  of medical care.

## 2024-02-19 ENCOUNTER — Ambulatory Visit: Admitting: Rheumatology

## 2024-02-19 ENCOUNTER — Ambulatory Visit
Admission: RE | Admit: 2024-02-19 | Discharge: 2024-02-19 | Disposition: A | Discharge status: Home / Self Care | Source: Ambulatory Visit | Attending: Rheumatology | Admitting: Rheumatology

## 2024-02-19 ENCOUNTER — Ambulatory Visit

## 2024-02-19 ENCOUNTER — Encounter: Payer: Self-pay | Admitting: Rheumatology

## 2024-02-19 VITALS — BP 133/87 | HR 76 | Temp 98.0°F | Resp 17 | Ht 68.0 in | Wt 227.4 lb

## 2024-02-19 DIAGNOSIS — Z86718 Personal history of other venous thrombosis and embolism: Secondary | ICD-10-CM | POA: Insufficient documentation

## 2024-02-19 DIAGNOSIS — R760 Raised antibody titer: Secondary | ICD-10-CM

## 2024-02-19 DIAGNOSIS — M7022 Olecranon bursitis, left elbow: Secondary | ICD-10-CM | POA: Diagnosis not present

## 2024-02-19 DIAGNOSIS — E559 Vitamin D deficiency, unspecified: Secondary | ICD-10-CM | POA: Insufficient documentation

## 2024-02-19 DIAGNOSIS — F419 Anxiety disorder, unspecified: Secondary | ICD-10-CM

## 2024-02-19 DIAGNOSIS — M0579 Rheumatoid arthritis with rheumatoid factor of multiple sites without organ or systems involvement: Secondary | ICD-10-CM | POA: Diagnosis present

## 2024-02-19 DIAGNOSIS — G8929 Other chronic pain: Secondary | ICD-10-CM | POA: Diagnosis present

## 2024-02-19 DIAGNOSIS — Z603 Acculturation difficulty: Secondary | ICD-10-CM

## 2024-02-19 DIAGNOSIS — R7689 Other specified abnormal immunological findings in serum: Secondary | ICD-10-CM | POA: Diagnosis present

## 2024-02-19 DIAGNOSIS — E782 Mixed hyperlipidemia: Secondary | ICD-10-CM | POA: Insufficient documentation

## 2024-02-19 DIAGNOSIS — M79641 Pain in right hand: Secondary | ICD-10-CM | POA: Diagnosis present

## 2024-02-19 DIAGNOSIS — M79642 Pain in left hand: Secondary | ICD-10-CM | POA: Insufficient documentation

## 2024-02-19 DIAGNOSIS — F32A Depression, unspecified: Secondary | ICD-10-CM

## 2024-02-19 DIAGNOSIS — B351 Tinea unguium: Secondary | ICD-10-CM

## 2024-02-19 DIAGNOSIS — Z758 Other problems related to medical facilities and other health care: Secondary | ICD-10-CM | POA: Diagnosis present

## 2024-02-19 DIAGNOSIS — Z79899 Other long term (current) drug therapy: Secondary | ICD-10-CM | POA: Diagnosis present

## 2024-02-19 DIAGNOSIS — M79671 Pain in right foot: Secondary | ICD-10-CM | POA: Insufficient documentation

## 2024-02-19 DIAGNOSIS — I10 Essential (primary) hypertension: Secondary | ICD-10-CM | POA: Diagnosis present

## 2024-02-19 DIAGNOSIS — K219 Gastro-esophageal reflux disease without esophagitis: Secondary | ICD-10-CM | POA: Insufficient documentation

## 2024-02-19 DIAGNOSIS — M545 Low back pain, unspecified: Secondary | ICD-10-CM | POA: Insufficient documentation

## 2024-02-19 DIAGNOSIS — Z7901 Long term (current) use of anticoagulants: Secondary | ICD-10-CM | POA: Diagnosis present

## 2024-02-19 DIAGNOSIS — M79672 Pain in left foot: Secondary | ICD-10-CM

## 2024-02-19 DIAGNOSIS — D696 Thrombocytopenia, unspecified: Secondary | ICD-10-CM | POA: Insufficient documentation

## 2024-02-19 DIAGNOSIS — F5101 Primary insomnia: Secondary | ICD-10-CM | POA: Insufficient documentation

## 2024-02-19 NOTE — Progress Notes (Signed)
 Pharmacy Note  Subjective: Patient presents today to [facility] for follow up office visit. Patient seen by the pharmacist for counseling on methotrexate for rheumatoid arthritis (RA). Prior therapy includes: none.  Objective: CBC    Component Value Date/Time   WBC 10.7 05/30/2021 1548   WBC 5.9 06/12/2016 0920   RBC 5.14 05/30/2021 1548   RBC 4.99 06/12/2016 0920   HGB 13.0 05/30/2021 1548   HCT 39.3 05/30/2021 1548   PLT 405 05/30/2021 1548   MCV 77 (L) 05/30/2021 1548   MCH 25.3 (L) 05/30/2021 1548   MCH 31.1 06/12/2016 0920   MCHC 33.1 05/30/2021 1548   MCHC 33.9 06/12/2016 0920   RDW 16.5 (H) 05/30/2021 1548   LYMPHSABS 1.6 10/23/2016 0956   MONOABS 354 06/12/2016 0920   EOSABS 0.2 10/23/2016 0956   BASOSABS 0.0 10/23/2016 0956    CMP     Component Value Date/Time   NA 138 05/30/2021 1548   K 4.7 05/30/2021 1548   CL 99 05/30/2021 1548   CO2 21 05/30/2021 1548   GLUCOSE 92 05/30/2021 1548   GLUCOSE 87 06/12/2016 0920   BUN 13 05/30/2021 1548   CREATININE 0.76 05/30/2021 1548   CREATININE 0.78 06/12/2016 0920   CALCIUM 9.7 05/30/2021 1548   PROT 7.1 08/28/2022 0823   ALBUMIN 4.5 08/28/2022 0823   AST 22 08/28/2022 0823   ALT 13 08/28/2022 0823   ALKPHOS 66 08/28/2022 0823   BILITOT 0.3 08/28/2022 0823   GFRNONAA >89 06/12/2016 0920   GFRAA >89 06/12/2016 0920    Baseline Immunosuppressant Therapy Labs TB GOLD   Hepatitis Panel   HIV No results found for: HIV Immunoglobulins   SPEP    Latest Ref Rng & Units 08/28/2022    8:23 AM  Serum Protein Electrophoresis  Total Protein 6.0 - 8.5 g/dL 7.1    H3EI No results found for: G6PDH TPMT No results found for: TPMT   Chest-xray: no recent x-ray, order placed  Contraception: IUD  Alcohol use: does not use  Assessment/Plan:   Patient was counseled on the purpose, proper use, and adverse effects of methotrexate including nausea, infection, and signs and symptoms of  pneumonitis. Discussed that there is the possibility of an increased risk of malignancy, specifically lymphomas, but it is not well understood if this increased risk is due to the medication or the disease state.  Instructed patient that medication should be held for infection and prior to surgery.  Advised patient to avoid live vaccines. Recommend annual influenza, Pneumovax 23, Prevnar 13, and Shingrix as indicated.   Reviewed instructions with patient to take methotrexate weekly along with folic acid daily.  Discussed the importance of frequent monitoring of kidney and liver function and blood counts, and provided patient with standing lab instructions.  Counseled patient to avoid NSAIDs and alcohol while on methotrexate.  Provided patient with educational materials on methotrexate and answered all questions.   Patient voiced understanding.  Patient consented to methotrexate use.  Will upload into chart.    Dose of methotrexate will be 15 mg once weekly for 2 weeks, increase to 20 mg once weekly along with folic acid 2 mg daily. Prescription pending labs.  Jenkins Graces, PharmD PGY1 Pharmacy Resident [phone]

## 2024-02-19 NOTE — Patient Instructions (Addendum)
 PLEASE COMPLETE CHEST XRAY  Methotrexate Tablets What is this medication? METHOTREXATE (METH oh TREX ate) treats autoimmune conditions, such as arthritis and psoriasis. It works by decreasing inflammation, which can reduce pain and prevent long-term injury to the joints and skin. It may also be used to treat some types of cancer. It works by slowing down the growth of cancer cells. This medicine may be used for other purposes; ask your health care provider or pharmacist if you have questions. COMMON BRAND NAME(S): Rheumatrex, Trexall What should I tell my care team before I take this medication? They need to know if you have any of these conditions: Dehydration Diabetes Fluid in the stomach area or lungs Frequently drink alcohol Having surgery, including dental surgery High cholesterol Immune system problems Inflammatory bowel disease, such as ulcerative colitis Kidney disease Liver disease Low blood cell levels (white cells, red cells, and platelets) Lung disease Recent or ongoing radiation Recent or upcoming vaccine Stomach ulcers, other stomach or intestine problems An unusual or allergic reaction to methotrexate, other medications, foods, dyes, or preservatives Pregnant or trying to get pregnant Breastfeeding How should I use this medication? Take this medication by mouth with water. Take it as directed on the prescription label. Do not take extra. Keep taking this medication until your care team tells you to stop. Know why you are taking this medication and how you should take it. To treat conditions such as arthritis and psoriasis, this medication is taken ONCE A WEEK as a single dose or divided into 3 smaller doses taken 12 hours apart (do not take more than 3 doses 12 hours apart each week). This medication is NEVER taken daily to treat conditions other than cancer. Taking this medication more often than directed can cause serious side effects, even death. Talk to your care team  about why you are taking this medication, how often you will take it, and what your dose is. Ask your care team to put the reason you take this medication on the prescription. If you take this medication ONCE A WEEK, choose a day of the week before you start. Ask your pharmacist to include the day of the week on the label. Avoid Monday, which could be misread as Morning. Handling this medication may be harmful. Talk to your care team about how to handle this medication. Special instructions may apply. Talk to your care team about the use of this medication in children. While it may be prescribed for selected conditions, precautions do apply. Overdosage: If you think you have taken too much of this medicine contact a poison control center or emergency room at once. NOTE: This medicine is only for you. Do not share this medicine with others. What if I miss a dose? If you miss a dose, talk with your care team. Do not take double or extra doses. What may interact with this medication? Do not take this medication with any of the following: Acitretin Live virus vaccines Probenecid This medication may also interact with the following: Alcohol Aspirin and aspirin-like medications Certain antibiotics, such as penicillin, neomycin, sulfamethoxazole; trimethoprim Certain medications for stomach problems, such as lansoprazole, omeprazole, pantoprazole Clozapine Cyclosporine Dapsone Folic acid Foscarnet NSAIDs, medications for pain and inflammation, such as ibuprofen or naproxen Phenytoin Pyrimethamine Steroid medications, such as prednisone or cortisone Tacrolimus Theophylline This list may not describe all possible interactions. Give your health care provider a list of all the medicines, herbs, non-prescription drugs, or dietary supplements you use. Also tell them if you  smoke, drink alcohol, or use illegal drugs. Some items may interact with your medicine. What should I watch for while using  this medication? Visit your care team for regular checks on your progress. It may be some time before you see the benefit from this medication. You may need blood work done while you are taking this medication. If your care team has also prescribed folic acid, they may instruct you to skip your folic acid dose on the day you take methotrexate. This medication can make you more sensitive to the sun. Keep out of the sun. If you cannot avoid being in the sun, wear protective clothing and sunscreen. Do not use sun lamps, tanning beds, or tanning booths. Check with your care team if you have severe diarrhea, nausea, and vomiting, or if you sweat a lot. The loss of too much body fluid may make it dangerous for you to take this medication. This medication may increase your risk of getting an infection. Call your care team for advice if you get a fever, chills, sore throat, or other symptoms of a cold or flu. Do not treat yourself. Try to avoid being around people who are sick. Talk to your care team about your risk of cancer. You may be more at risk for certain types of cancers if you take this medication. Talk to your care team if you or your partner may be pregnant. Serious birth defects can occur if you take this medication during pregnancy and for 6 months after the last dose. You will need a negative pregnancy test before starting this medication. Contraception is recommended while taking this medication and for 6 months after the last dose. Your care team can help you find the option that works for you. If your partner can get pregnant, use a condom during sex while taking this medication and for 3 months after the last dose. Do not breastfeed while taking this medication and for 1 week after the last dose. This medication may cause infertility. Talk to your care team if you are concerned about your fertility. What side effects may I notice from receiving this medication? Side effects that you should  report to your care team as soon as possible: Allergic reactions--skin rash, itching, hives, swelling of the face, lips, tongue, or throat Dry cough, shortness of breath or trouble breathing Infection--fever, chills, cough, sore throat, wounds that don't heal, pain or trouble when passing urine, general feeling of discomfort or being unwell Kidney injury--decrease in the amount of urine, swelling of the ankles, hands, or feet Liver injury--right upper belly pain, loss of appetite, nausea, light-colored stool, dark yellow or brown urine, yellowing skin or eyes, unusual weakness or fatigue Low red blood cell level--unusual weakness or fatigue, dizziness, headache, trouble breathing Pain, tingling, or numbness in the hands or feet, muscle weakness, change in vision, confusion or trouble speaking, loss of balance or coordination, trouble walking, seizures Redness, blistering, peeling, or loosening of the skin, including inside the mouth Stomach bleeding--bloody or black, tar-like stools, vomiting blood or brown material that looks like coffee grounds Stomach pain that is severe, does not go away, or gets worse Unusual bruising or bleeding Side effects that usually do not require medical attention (report these to your care team if they continue or are bothersome): Diarrhea Dizziness Hair loss Nausea Pain, redness, or swelling with sores inside the mouth or throat Skin reactions on sun-exposed areas Vomiting This list may not describe all possible side effects. Call your doctor for  medical advice about side effects. You may report side effects to FDA at 1-800-FDA-1088. Where should I keep my medication? Keep out of the reach of children and pets. Store at room temperature between 20 and 25 degrees C (68 and 77 degrees F). Protect from light. Keep the container tightly closed. Get rid of any unused medication after the expiration date. To get rid of medications that are no longer needed or have  expired: Take the medication to a medication take-back program. Check with your pharmacy or law enforcement to find a location. If you cannot return the medication, ask your pharmacist or care team how to get rid of this medication safely. NOTE: This sheet is a summary. It may not cover all possible information. If you have questions about this medicine, talk to your doctor, pharmacist, or health care provider.  2024 [employer] Standard (2023-02-22 00:00:00)  Standing Labs We placed an order today for your standing lab work.   Please have your standing labs drawn in 2 weeks  and then every 3 months  Please have your labs drawn 2 weeks prior to your appointment so that the provider can discuss your lab results at your appointment, if possible.  Please note that you may see your imaging and lab results in MyChart before we have reviewed them. We will contact you once all results are reviewed. Please allow our office up to 72 hours to thoroughly review all of the results before contacting the office for clarification of your results.  WALK-IN LAB HOURS  Monday through Thursday from 8:00 am - 4:30 pm and Friday from 8:00 am-12:00 pm.  Patients with office visits requiring labs will be seen before walk-in labs.  You may encounter longer than normal wait times. Please allow additional time. Wait times may be shorter on  Monday and Thursday afternoons.  We do not book appointments for walk-in labs. We appreciate your patience and understanding with our staff.   Labs are drawn by [facility]. Please bring your co-pay at the time of your lab draw.  You may receive a bill from [facility] for your lab work.  Please note if you are on Hydroxychloroquine and and an order has been placed for a Hydroxychloroquine level,  you will need to have it drawn 4 hours or more after your last dose.  If you wish to have your labs drawn at another location, please call the office 24 hours in advance so we can fax the  orders.  The office is located at [address], [address], [place], [place] [address]   If you have any questions regarding directions or hours of operation,  please call [phone].   As a reminder, please drink plenty of water prior to coming for your lab work. Thanks!   Vaccines You are taking a medication(s) that can suppress your immune system.  The following immunizations are recommended: Flu annually RSV Covid-19  Td/Tdap (tetanus, diphtheria, pertussis) every 10 years Pneumonia (Prevnar 15 then Pneumovax 23 at least 1 year apart.  Alternatively, can take Prevnar 20 without needing additional dose) Shingrix: 2 doses from 4 weeks to 6 months apart  Please check with your PCP to make sure you are up to date.   .If you have signs or symptoms of an infection or start antibiotics: First, call your PCP for workup of your infection. Hold your medication through the infection, until you complete your antibiotics, and until symptoms resolve if you take the following: Injectable medication (Actemra, Benlysta, Cimzia, Cosentyx,  Enbrel, Humira, Kevzara, Orencia, Remicade, Simponi, Stelara, Taltz, Tremfya) Methotrexate Leflunomide (Arava) Mycophenolate (Cellcept) Earma Jewel, or Rinvoq

## 2024-02-23 ENCOUNTER — Ambulatory Visit: Payer: Self-pay | Admitting: Rheumatology

## 2024-02-23 NOTE — Progress Notes (Signed)
 Hemoglobin is low.  Patient should take multivitamin with iron.  ANA is low titer positive which is not significant.  All other labs are within normal limits.  I would like to discuss results at the follow-up visit before starting her on any medications.  Please schedule a follow-up appointment.

## 2024-02-25 ENCOUNTER — Encounter: Payer: Self-pay | Admitting: Rheumatology

## 2024-02-25 ENCOUNTER — Ambulatory Visit: Attending: Rheumatology | Admitting: Rheumatology

## 2024-02-25 VITALS — BP 140/92 | HR 87 | Temp 98.5°F | Resp 14 | Ht 68.0 in | Wt 226.2 lb

## 2024-02-25 DIAGNOSIS — I10 Essential (primary) hypertension: Secondary | ICD-10-CM

## 2024-02-25 DIAGNOSIS — K219 Gastro-esophageal reflux disease without esophagitis: Secondary | ICD-10-CM

## 2024-02-25 DIAGNOSIS — R760 Raised antibody titer: Secondary | ICD-10-CM

## 2024-02-25 DIAGNOSIS — D649 Anemia, unspecified: Secondary | ICD-10-CM

## 2024-02-25 DIAGNOSIS — M545 Low back pain, unspecified: Secondary | ICD-10-CM

## 2024-02-25 DIAGNOSIS — F5101 Primary insomnia: Secondary | ICD-10-CM

## 2024-02-25 DIAGNOSIS — Z7901 Long term (current) use of anticoagulants: Secondary | ICD-10-CM

## 2024-02-25 DIAGNOSIS — M1380 Other specified arthritis, unspecified site: Secondary | ICD-10-CM | POA: Diagnosis not present

## 2024-02-25 DIAGNOSIS — F419 Anxiety disorder, unspecified: Secondary | ICD-10-CM

## 2024-02-25 DIAGNOSIS — M79671 Pain in right foot: Secondary | ICD-10-CM

## 2024-02-25 DIAGNOSIS — Z603 Acculturation difficulty: Secondary | ICD-10-CM

## 2024-02-25 DIAGNOSIS — M7022 Olecranon bursitis, left elbow: Secondary | ICD-10-CM

## 2024-02-25 DIAGNOSIS — E559 Vitamin D deficiency, unspecified: Secondary | ICD-10-CM

## 2024-02-25 DIAGNOSIS — E782 Mixed hyperlipidemia: Secondary | ICD-10-CM

## 2024-02-25 DIAGNOSIS — Z79899 Other long term (current) drug therapy: Secondary | ICD-10-CM | POA: Diagnosis not present

## 2024-02-25 DIAGNOSIS — Z758 Other problems related to medical facilities and other health care: Secondary | ICD-10-CM

## 2024-02-25 DIAGNOSIS — Z86718 Personal history of other venous thrombosis and embolism: Secondary | ICD-10-CM

## 2024-02-25 DIAGNOSIS — M79672 Pain in left foot: Secondary | ICD-10-CM

## 2024-02-25 DIAGNOSIS — F32A Depression, unspecified: Secondary | ICD-10-CM

## 2024-02-25 DIAGNOSIS — M79641 Pain in right hand: Secondary | ICD-10-CM | POA: Diagnosis not present

## 2024-02-25 DIAGNOSIS — G8929 Other chronic pain: Secondary | ICD-10-CM

## 2024-02-25 DIAGNOSIS — R7689 Other specified abnormal immunological findings in serum: Secondary | ICD-10-CM

## 2024-02-25 DIAGNOSIS — M79642 Pain in left hand: Secondary | ICD-10-CM

## 2024-02-25 MED ORDER — LIDOCAINE HCL 1 % IJ SOLN
1.5000 mL | INTRAMUSCULAR | Status: AC | PRN
  Administered 2024-02-25: 1.5 mL

## 2024-02-25 NOTE — Patient Instructions (Signed)
 Elbow Bursitis  Elbow bursitis is the swelling of the fluid-filled sac (bursa) at the tip of the elbow. A bursa is like a cushion that protects the joint. If the bursa becomes irritated, it can fill with extra fluid and become swollen. What are the causes? Injury to the elbow. Leaning the elbow on a hard surface for a long time. Infection. Bone spurs. Certain conditions that cause swelling. Sometimes, the cause is not known. What are the signs or symptoms? The first sign of this condition is often swelling at the tip of the elbow. The swelling can grow to the size of a golf ball. Other symptoms include: Pain when bending or leaning on the elbow. Stiffness of the elbow. If the cause is infection, you may have: Redness, warmth, and tenderness. Pus coming from a cut near the elbow. How is this treated? Treatment depends on the cause. It may include: Medicines. Draining fluid from the bursa. Placing a bandage or pressure (compression) sleeve around the elbow. Wearing elbow pads. Surgery, if other treatments do not help. Follow these instructions at home: Medicines Take over-the-counter and prescription medicines only as told by your doctor. If you were prescribed an antibiotic medicine, take it as told by your doctor. Do not stop taking it even if you start to feel better. Managing pain, stiffness, and swelling     If told, put ice on the elbow. To do this: Put ice in a plastic bag. Place a towel between your skin and the bag. Leave the ice on for 20 minutes, 2-3 times a day. Take off the ice if your skin turns bright red. This is very important. If you cannot feel pain, heat, or cold, you have a greater risk of damage to the area. If told, put heat on the affected area. Do this as often as told by your doctor. Use the heat source that your doctor recommends, such as a moist heat pack or a heating pad. Place a towel between your skin and the heat source. Leave the heat on for 20-30  minutes. Take off the heat if your skin turns bright red. This is very important. If you cannot feel pain, heat, or cold, you have a greater risk of getting burned. If your bursitis is caused by an injury, follow instructions from your doctor about: Resting your elbow. Wearing a bandage or sleeve. Wear elbow pads or elbow wraps as needed. These help cushion your elbow. General instructions Avoid any activities that cause elbow pain. Ask your doctor what activities are safe for you. Keep all follow-up visits. Contact a doctor if: You have a fever. You have problems that do not get better with treatment. You have pain or swelling that: Gets worse. Goes away and then comes back. You have pus draining from your elbow. You have redness around the elbow area. Your elbow feels warm to the touch. Get help right away if: You have trouble moving your arm, hand, or fingers. Summary Elbow bursitis is the swelling of the fluid-filled sac (bursa) at the tip of the elbow. You may need to take medicine or put ice on your elbow. Contact your doctor if your problems do not get better with treatment. Also, contact your doctor if your problems go away and then come back. This information is not intended to replace advice given to you by your health care provider. Make sure you discuss any questions you have with your health care provider. Document Revised: 03/07/2021 Document Reviewed: 03/07/2021 Else[employer] Patient Education  2024 [employer]Hand Exercises Hand exercises can be helpful for almost anyone. They can strengthen your hands and improve flexibility and movement. The exercises can also increase blood flow to the hands. These results can make your work and daily tasks easier for you. Hand exercises can be especially helpful for people who have joint pain from arthritis or nerve damage from using their hands over and over. These exercises can also help people who injure a hand. Exercises Most of  these hand exercises are gentle stretching and motion exercises. It is usually safe to do them often throughout the day. Warming up your hands before exercise may help reduce stiffness. You can do this with gentle massage or by placing your hands in warm water for 10-15 minutes. It is normal to feel some stretching, pulling, tightness, or mild discomfort when you begin new exercises. In time, this will improve. Remember to always be careful and stop right away if you feel sudden, very bad pain or your pain gets worse. You want to get better and be safe. Ask your health care provider which exercises are safe for you. Do exercises exactly as told by your provider and adjust them as told. Do not begin these exercises until told by your provider. Knuckle bend or claw fist  Stand or sit with your arm, hand, and all five fingers pointed straight up. Make sure to keep your wrist straight. Gently bend your fingers down toward your palm until the tips of your fingers are touching your palm. Keep your big knuckle straight and only bend the small knuckles in your fingers. Hold this position for 10 seconds. Straighten your fingers back to your starting position. Repeat this exercise 5-10 times with each hand. Full finger fist  Stand or sit with your arm, hand, and all five fingers pointed straight up. Make sure to keep your wrist straight. Gently bend your fingers into your palm until the tips of your fingers are touching the middle of your palm. Hold this position for 10 seconds. Extend your fingers back to your starting position, stretching every joint fully. Repeat this exercise 5-10 times with each hand. Straight fist  Stand or sit with your arm, hand, and all five fingers pointed straight up. Make sure to keep your wrist straight. Gently bend your fingers at the big knuckle, where your fingers meet your hand, and at the middle knuckle. Keep the knuckle at the tips of your fingers straight and try to  touch the bottom of your palm. Hold this position for 10 seconds. Extend your fingers back to your starting position, stretching every joint fully. Repeat this exercise 5-10 times with each hand. Tabletop  Stand or sit with your arm, hand, and all five fingers pointed straight up. Make sure to keep your wrist straight. Gently bend your fingers at the big knuckle, where your fingers meet your hand, as far down as you can. Keep the small knuckles in your fingers straight. Think of forming a tabletop with your fingers. Hold this position for 10 seconds. Extend your fingers back to your starting position, stretching every joint fully. Repeat this exercise 5-10 times with each hand. Finger spread  Place your hand flat on a table with your palm facing down. Make sure your wrist stays straight. Spread your fingers and thumb apart from each other as far as you can until you feel a gentle stretch. Hold this position for 10 seconds. Bring your fingers and thumb tight together again. Hold this position for  10 seconds. Repeat this exercise 5-10 times with each hand. Making circles  Stand or sit with your arm, hand, and all five fingers pointed straight up. Make sure to keep your wrist straight. Make a circle by touching the tip of your thumb to the tip of your index finger. Hold for 10 seconds. Then open your hand wide. Repeat this motion with your thumb and each of your fingers. Repeat this exercise 5-10 times with each hand. Thumb motion  Sit with your forearm resting on a table and your wrist straight. Your thumb should be facing up toward the ceiling. Keep your fingers relaxed as you move your thumb. Lift your thumb up as high as you can toward the ceiling. Hold for 10 seconds. Bend your thumb across your palm as far as you can, reaching the tip of your thumb for the small finger (pinkie) side of your palm. Hold for 10 seconds. Repeat this exercise 5-10 times with each hand. Grip  strengthening  Hold a stress ball or other soft ball in the middle of your hand. Slowly increase the pressure, squeezing the ball as much as you can without causing pain. Think of bringing the tips of your fingers into the middle of your palm. All of your finger joints should bend when doing this exercise. Hold your squeeze for 10 seconds, then relax. Repeat this exercise 5-10 times with each hand. Contact a health care provider if: Your hand pain or discomfort gets much worse when you do an exercise. Your hand pain or discomfort does not improve within 2 hours after you exercise. If you have either of these problems, stop doing these exercises right away. Do not do them again unless your provider says that you can. Get help right away if: You develop sudden, severe hand pain or swelling. If this happens, stop doing these exercises right away. Do not do them again unless your provider says that you can. This information is not intended to replace advice given to you by your health care provider. Make sure you discuss any questions you have with your health care provider. Document Revised: 03/27/2022 Document Reviewed: 03/27/2022 Elsevier Patient Education  2024 [employer]Osteoarthritis  Osteoarthritis is a type of arthritis. It refers to joint pain or joint disease. Osteoarthritis affects tissue that covers the ends of bones in joints (cartilage). Cartilage acts as a cushion between the bones and helps them move smoothly. Osteoarthritis occurs when cartilage in the joints gets worn down. Osteoarthritis is sometimes called wear and tear arthritis. Osteoarthritis is the most common form of arthritis. It often occurs in older people. It is a condition that gets worse over time. The joints most often affected by this condition are in the fingers, toes, hips, knees, and spine, including the neck and lower back. What are the causes? This condition is caused by the wearing down of cartilage that  covers the ends of bones. What increases the risk? The following factors may make you more likely to develop this condition: Being age 70 or older. Obesity. Overuse of joints. Past injury of a joint. Past surgery on a joint. Family history of osteoarthritis. What are the signs or symptoms? The main symptoms of this condition are pain, swelling, and stiffness in the joint. Other symptoms may include: An enlarged joint. More pain and further damage caused by small pieces of bone or cartilage that break off and float inside of the joint. Small deposits of bone (osteophytes) that grow on the edges of the joint.  A grating or scraping feeling inside the joint when you move it. Popping or creaking sounds when you move. Difficulty walking or exercising. An inability to grip items, twist your hand, or control the movements of your hands and fingers. How is this diagnosed? This condition may be diagnosed based on: Your medical history. A physical exam. Your symptoms. X-rays of the affected joints. Blood tests to rule out other types of arthritis. How is this treated? There is no cure for this condition, but treatment can help control pain and improve joint function. Treatment may include a combination of therapies, such as: Pain relief techniques, such as: Applying heat and cold to the joint. Massage. A form of talk therapy called cognitive behavioral therapy (CBT). This therapy helps you set goals and follow up on the changes that you make. Medicines for pain and inflammation. The medicines can be taken by mouth or applied to the skin. They include: NSAIDs, such as ibuprofen. Prescription medicines. Strong anti-inflammatory medicines (corticosteroids). Certain nutritional supplements. A prescribed exercise program. You may work with a physical therapist. Assistive devices, such as a brace, wrap, splint, specialized glove, or cane. A weight control plan. Surgery, such as: An osteotomy.  This is done to reposition the bones and relieve pain or to remove loose pieces of bone and cartilage. Joint replacement surgery. You may need this surgery if you have advanced osteoarthritis. Follow these instructions at home: Activity Rest your affected joints as told by your health care provider. Exercise as told by your provider. The provider may recommend specific types of exercise, such as: Strengthening exercises. These are done to strengthen the muscles that support joints affected by arthritis. Aerobic activities. These are exercises, such as brisk walking or water aerobics, that increase your heart rate. Range-of-motion activities. These help your joints move more easily. Balance and agility exercises. Managing pain, stiffness, and swelling     If told, apply heat to the affected area as often as told by your provider. Use the heat source that your provider recommends, such as a moist heat pack or a heating pad. If you have a removable assistive device, remove it as told by your provider. Place a towel between your skin and the heat source. If your provider tells you to keep the assistive device on while you apply heat, place a towel between the assistive device and the heat source. Leave the heat on for 20-30 minutes. If told, put ice on the affected area. If you have a removable assistive device, remove it as told by your provider. Put ice in a plastic bag. Place a towel between your skin and the bag. If your provider tells you to keep the assistive device on during icing, place a towel between the assistive device and the bag. Leave the ice on for 20 minutes, 2-3 times a day. If your skin turns bright red, remove the ice or heat right away to prevent skin damage. The risk of damage is higher if you cannot feel pain, heat, or cold. Move your fingers or toes often to reduce stiffness and swelling. Raise (elevate) the affected area above the level of your heart while you are  sitting or lying down. General instructions Take over-the-counter and prescription medicines only as told by your provider. Maintain a healthy weight. Follow instructions from your provider for weight control. Do not use any products that contain nicotine or tobacco. These products include cigarettes, chewing tobacco, and vaping devices, such as e-cigarettes. If you need help quitting, ask  your provider. Use assistive devices as told by your provider. Where to find more information [employer] and Musculoskeletal [employer] Diseases: niams.http://www.myers.net/ [employer] on Aging: baseringtones.pl [employer] of Rheumatology: rheumatology.org Contact a health care provider if: You have redness, swelling, or a feeling of warmth in a joint that gets worse. You have a fever along with joint or muscle aches. You develop a rash. You have trouble doing your normal activities. You have pain that gets worse and is not relieved by pain medicine. This information is not intended to replace advice given to you by your health care provider. Make sure you discuss any questions you have with your health care provider. Document Revised: 11/09/2021 Document Reviewed: 11/09/2021 Elsevier Patient Education  2024 [employer].

## 2024-02-25 NOTE — Progress Notes (Signed)
 Chest x-ray is unremarkable per radiology report.  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  Patient is coming to the office today and we we will discuss results during the office visit.

## 2024-02-25 NOTE — Progress Notes (Signed)
 Office Visit Note  Patient: Cheryl Patel             Date of Birth: Jul 26, 1970           MRN: [id number]             PCP: [facility] [place] Referring: [facility] Visit Date: 02/25/2024 Occupation: Data Unavailable  Interpreter: Elvie (son)  Subjective:  Pain in joints  History of Present Illness: Cheryl Patel is a 53 y.o. female with previous history of rheumatoid arthritis based on polyarthralgia and positive ultrasound.  She returns today after her last visit on February 19, 2024.  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  She continues to have pain and discomfort in her joints.  She has not noticed any joint swelling.  She had left olecranon bursa which is still bothersome.  She states her joints were more swollen but after she took diuretics her swelling improved.  She continues to be on Eliquis because of DVT.  She also had positive lupus anticoagulant in the past I am uncertain it was prior to starting Eliquis or after.    Activities of Daily Living:  Patient reports morning stiffness for 30 minutes.   Patient Denies nocturnal pain.  Difficulty dressing/grooming: Denies Difficulty climbing stairs: Denies Difficulty getting out of chair: Denies Difficulty using hands for taps, buttons, cutlery, and/or writing: Reports  Review of Systems  Constitutional:  Positive for fatigue.  HENT:  Negative for mouth sores and mouth dryness.   Eyes:  Negative for dryness.  Respiratory:  Negative for shortness of breath.   Cardiovascular:  Negative for chest pain and palpitations.  Gastrointestinal:  Negative for blood in stool, constipation and diarrhea.  Endocrine: Negative for increased urination.  Genitourinary:  Negative for involuntary urination.  Musculoskeletal:  Positive for joint pain, joint pain, joint swelling, myalgias, muscle weakness, morning stiffness, muscle tenderness and myalgias. Negative for gait  problem.  Skin:  Positive for hair loss and sensitivity to sunlight. Negative for color change and rash.  Allergic/Immunologic: Negative for susceptible to infections.  Neurological:  Negative for dizziness and headaches.  Hematological:  Negative for swollen glands.  Psychiatric/Behavioral:  Positive for depressed mood and sleep disturbance. The patient is nervous/anxious.     PMFS History:  Patient Active Problem List   Diagnosis Date Noted   Localized swelling of left lower extremity 04/15/2019   Lupus anticoagulant positive 06/24/2017   Chronic anticoagulation 06/24/2017   Onychomycosis 10/23/2016   History of DVT (deep vein thrombosis) 01/28/2013   Essential hypertension 01/28/2013    Past Medical History:  Diagnosis Date   DVT (deep venous thrombosis) (HCC)    History of DVT (deep vein thrombosis)    LLE - xarelto , history of left iliac vein stenting at [facility]   Hypercoagulable state    positive lupus anticoagulant   Hypertension     Family History  Problem Relation Age of Onset   Hypertension Mother    Breast cancer Sister    Pancreatic cancer Brother    Healthy Son    Healthy Son    Healthy Daughter    Past Surgical History:  Procedure Laterality Date   CESAREAN SECTION  04/1999   x2   CHOLECYSTECTOMY     TRANSTHORACIC ECHOCARDIOGRAM  03/31/2012   EF 50-55%, normal systolucn function   VEIN SURGERY  2005   stent in LE vein at [facility]   Social History  Tobacco Use   Smoking status: Never    Passive exposure: Never   Smokeless tobacco: Never  Vaping Use   Vaping status: Never Used  Substance Use Topics   Alcohol use: No   Drug use: No   Social History   Social History Narrative   From [place]     Immunization History  Administered Date(s) Administered   Influenza Inj Mdck Quad With Preservative 02/20/2019   Moderna Sars-Covid-2 Vaccination 06/02/2019     Objective: Vital Signs: BP (!) 140/92   Pulse 87   Temp 98.5 F (36.9 C)    Resp 14   Ht 5' 8 (1.727 m)   Wt 226 lb 3.2 oz (102.6 kg)   BMI 34.39 kg/m    Physical Exam Vitals and nursing note reviewed.  Constitutional:      Appearance: She is well-developed.  HENT:     Head: Normocephalic and atraumatic.  Eyes:     Conjunctiva/sclera: Conjunctivae normal.  Cardiovascular:     Rate and Rhythm: Normal rate and regular rhythm.     Heart sounds: Normal heart sounds.  Pulmonary:     Effort: Pulmonary effort is normal.     Breath sounds: Normal breath sounds.  Abdominal:     General: Bowel sounds are normal.     Palpations: Abdomen is soft.  Musculoskeletal:     Cervical back: Normal range of motion.  Lymphadenopathy:     Cervical: No cervical adenopathy.  Skin:    General: Skin is warm and dry.     Capillary Refill: Capillary refill takes less than 2 seconds.  Neurological:     Mental Status: She is alert and oriented to person, place, and time.  Psychiatric:        Behavior: Behavior normal.      Musculoskeletal Exam: Cervical, thoracic and lumbar spine were in good range of motion.  There was no SI joint tenderness.  Shoulder joints, elbow joints, wrist joints, MCPs, PIPs and DIPs were in good range of motion with no synovitis.  Bilateral PIP and DIP thickening was noted.  No synovitis was noted.  She had left olecranon bursitis without erythema or warmth.  Hip joints and knee joints were in good range of motion without any warmth swelling or effusion.  There was no tenderness over ankles or MTPs.  No synovitis was noted.   CDAI Exam: CDAI Score: -- Patient Global: --; Provider Global: -- Swollen: --; Tender: -- Joint Exam 02/25/2024   No joint exam has been documented for this visit   There is currently no information documented on the homunculus. Go to the Rheumatology activity and complete the homunculus joint exam.  Investigation: No additional findings.  Imaging: DG Chest 2 View Result Date: 02/25/2024 CLINICAL DATA:  Rheumatoid  arthritis EXAM: CHEST - 2 VIEW COMPARISON:  None Available. FINDINGS: The heart size and mediastinal contours are within normal limits. Both lungs are clear. Degenerative changes in the thoracic spine. IMPRESSION: No active cardiopulmonary disease. Electronically Signed   By: Maude Naegeli M.D.   On: 02/25/2024 09:23   XR Foot 2 Views Left Result Date: 02/19/2024 First MTP, PIP and DIP narrowing was noted.  No tarsal, tibiotalar or subtalar joint space narrowing was noted.  Inferior and posterior calcaneal spurs were noted. Impression: These findings history of osteoarthritis of the foot.  XR Foot 2 Views Right Result Date: 02/19/2024 First MTP, PIP and DIP narrowing was noted.  No tarsal, tibiotalar or subtalar joint space narrowing was noted.  Inferior and posterior calcaneal spurs were noted. Impression: These findings history of osteoarthritis of the foot.  XR Hand 2 View Left Result Date: 02/19/2024 No MCP, PIP, DIP, intercarpal or radiocarpal joint space narrowing was noted.  No erosive changes were noted. Impression: Unremarkable x-rays of the hand.  XR Hand 2 View Right Result Date: 02/19/2024 No MCP, PIP, DIP, intercarpal or radiocarpal joint space narrowing was noted.  No erosive changes were noted. Impression: Unremarkable x-rays of the hand.   Recent Labs: Lab Results  Component Value Date   WBC 6.8 02/19/2024   HGB 10.7 (L) 02/19/2024   PLT 448 (H) 02/19/2024   NA 138 02/19/2024   K 4.8 02/19/2024   CL 103 02/19/2024   CO2 23 02/19/2024   GLUCOSE 85 02/19/2024   BUN 17 02/19/2024   CREATININE 0.75 02/19/2024   BILITOT 0.3 02/19/2024   ALKPHOS 66 08/28/2022   AST 18 02/19/2024   ALT 13 02/19/2024   PROT 7.5 02/19/2024   PROT 8.1 02/19/2024   ALBUMIN 4.5 08/28/2022   CALCIUM 9.6 02/19/2024   GFRAA >89 06/12/2016   QFTBGOLDPLUS NEGATIVE 02/19/2024   February 19, 2024 SPEP normal, immunoglobulins normal, TB Gold negative, hepatitis B and C nonreactive,  anticardiolipin negative, beta-2 GP 1 negative, lupus anticoagulant negative, ANA 1: 40 cytoplasmic, ENA panel negative, C3-C4 normal, RF negative, anti-CCP negative, sed rate normal  Speciality Comments: No specialty comments available.  Procedures:  Medium Joint Inj: L olecranon bursa on 02/25/2024 4:30 PM Indications: pain Details: 27 G 1.5 in needle, posterior approach Medications: 1.5 mL lidocaine 1 % Aspirate: 5 mL clear Outcome: tolerated well, no immediate complications  Risk of infection, tendon injury, nerve injury, hypopigmentation and dermal atrophy were discussed. Procedure, treatment alternatives, risks and benefits explained, specific risks discussed. Consent was given by the patient. Immediately prior to procedure a time out was called to verify the correct patient, procedure, equipment, support staff and site/side marked as required. Patient was prepped and draped in the usual sterile fashion.     Allergies: Patient has no known allergies.   Assessment / Plan:     Visit Diagnoses: Chronic inflammatory arthritis - RF-, CCP Ab-, ANA 1:40: Patient was diagnosed with rheumatoid arthritis by Dr. Mai in 2014.  She was started on Plaquenil after an ultrasound evaluation.  She lost follow-up with Dr. Mai and was followed by her PCP and received Plaquenil through her PCP.  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  She has been off Plaquenil for 2 months.  No synovitis was noted on the examination at the last visit and also at this visit.  I advised patient to contact me if she develops any increased joint swelling.February 19, 2024 SPEP normal, immunoglobulins normal, TB Gold negative, hepatitis B and C nonreactive, anticardiolipin negative, beta-2 GP 1 negative, lupus anticoagulant negative, ANA 1: 40 cytoplasmic, ENA panel negative, C3-C4 normal, RF negative, anti-CCP negative, sed rate normal.  Labs were reviewed with the patient and her son.  High risk  medication use-Plaquenil since December 19, 2012 until October 2025.  Pain in both hands-she complains of a stiffness in her hands.  No synovitis was noted on the examination.  Bilateral PIP and DIP thickening was noted.  Joint protection muscle strengthening was discussed.  Olecranon bursitis of left elbow -she had a small left olecranon bursitis which has been bothersome.  Per patient's request after informed consent was obtained the area was prepped in sterile fashion and aspirated.  The synovial  fat was sent for analysis.  Will contact her once the lab results are available.  Plan: Synovial Fluid Analysis, Complete  Pain in both feet-she complains of discomfort in her feet.  X-rays obtained at the last visit were unremarkable.  X-ray findings were reviewed with the patient.  She continues to have some tenderness over her MTPs.  Chronic midline low back pain without sciatica-she gives history of chronic intermittent lower back pain.  Positive ANA (antinuclear antibody)-ANA was low titer positive which was not significant.  ENA panel was negative and complements were normal.  As she recently discontinued Plaquenil I would like to observe.  History of DVT (deep vein thrombosis)-stenosis of left common iliac vein under care of Dr. Wadie.  She also has history of DVT while she was pregnant.  Lupus anticoagulant positive - Repeat lupus anticoagulant negative but patient is on Xarelto .  Lupus coagulant could be false positive or false negative while the patient is on Xarelto .  Chronic anticoagulation-Xarelto  20 mg p.o. daily.  Anemia unspecified type-advised patient to follow-up with her PCP regarding anemia.  Essential hypertension-blood pressure was elevated at 140/89.  Pete blood pressure was 140/92.  Patient was advised to monitor blood pressure closely and follow-up with the PCP.  Other medical problems are listed as follows:  Mixed hyperlipidemia  Gastroesophageal reflux disease without  esophagitis  Primary insomnia  Vitamin D deficiency  Anxiety and depression  Language barrier-her son Edwin was interpreter during the office visit.  Orders: Orders Placed This Encounter  Procedures   Medium Joint Inj   Synovial Fluid Analysis, Complete   No orders of the defined types were placed in this encounter.   Follow-Up Instructions: Return in about 3 months (around 05/25/2024) for Chronic inflammatory arthritis, Osteoarthritis.   Maya Nash, MD  Note - This record has been created using Animal nutritionist.  Chart creation errors have been sought, but may not always  have been located. Such creation errors do not reflect on  the standard of medical care.

## 2024-02-26 ENCOUNTER — Ambulatory Visit: Payer: Self-pay | Admitting: Rheumatology

## 2024-02-26 LAB — SYNOVIAL FLUID ANALYSIS, COMPLETE
Basophils, %: 0 %
Eosinophils-Synovial: 0 % (ref 0–2)
Lymphocytes-Synovial Fld: 33 % (ref 0–74)
Monocyte/Macrophage: 57 % (ref 0–69)
Neutrophil, Synovial: 9 % (ref 0–24)
Synoviocytes, %: 7 % (ref 0–15)
WBC, Synovial: 700 {cells}/uL — ABNORMAL HIGH (ref <150)

## 2024-02-26 NOTE — Progress Notes (Signed)
 Synovial fluid is not inflammatory.

## 2024-02-26 NOTE — Progress Notes (Signed)
 If the fluid comes back then we can inject otherwise no need.

## 2024-02-29 LAB — LUPUS ANTICOAGULANT EVAL W/ REFLEX
PTT-LA Screen: 34 s (ref <=40)
dRVVT: 47 s — ABNORMAL HIGH (ref <=45)

## 2024-02-29 LAB — QUANTIFERON-TB GOLD PLUS
Mitogen-NIL: 7.3 [IU]/mL
NIL: 0.01 [IU]/mL
QuantiFERON-TB Gold Plus: NEGATIVE
TB1-NIL: 0.19 [IU]/mL
TB2-NIL: 0.14 [IU]/mL

## 2024-02-29 LAB — SJOGRENS SYNDROME-A EXTRACTABLE NUCLEAR ANTIBODY: SSA (Ro) (ENA) Antibody, IgG: <1 AI

## 2024-02-29 LAB — CYCLIC CITRUL PEPTIDE ANTIBODY, IGG: Cyclic Citrullin Peptide Ab: <16 U

## 2024-02-29 LAB — COMPREHENSIVE METABOLIC PANEL WITH GFR
AG Ratio: 1.5 (calc) (ref 1.0–2.5)
ALT: 13 U/L (ref 6–29)
AST: 18 U/L (ref 10–35)
Albumin: 4.5 g/dL (ref 3.6–5.1)
Alkaline phosphatase (APISO): 69 U/L (ref 37–153)
BUN: 17 mg/dL (ref 7–25)
CO2: 23 mmol/L (ref 20–32)
Calcium: 9.6 mg/dL (ref 8.6–10.4)
Chloride: 103 mmol/L (ref 98–110)
Creat: 0.75 mg/dL (ref 0.50–1.03)
Globulin: 3 g/dL (ref 1.9–3.7)
Glucose, Bld: 85 mg/dL (ref 65–99)
Potassium: 4.8 mmol/L (ref 3.5–5.3)
Sodium: 138 mmol/L (ref 135–146)
Total Bilirubin: 0.3 mg/dL (ref 0.2–1.2)
Total Protein: 7.5 g/dL (ref 6.1–8.1)
eGFR: 94 mL/min/1.73m2 (ref >=60)

## 2024-02-29 LAB — RHEUMATOID FACTOR: Rheumatoid fact SerPl-aCnc: <10 [IU]/mL (ref <14)

## 2024-02-29 LAB — CARDIOLIPIN ANTIBODIES, IGG, IGM, IGA
Anticardiolipin IgA: <2 [APL'U]/mL (ref <20.0)
Anticardiolipin IgG: <2 [GPL'U]/mL (ref <20.0)
Anticardiolipin IgM: <2 [MPL'U]/mL (ref <20.0)

## 2024-02-29 LAB — CBC WITH DIFFERENTIAL/PLATELET
Absolute Lymphocytes: 1673 {cells}/uL (ref 850–3900)
Absolute Monocytes: 299 {cells}/uL (ref 200–950)
Basophils Absolute: 27 {cells}/uL (ref 0–200)
Basophils Relative: 0.4 %
Eosinophils Absolute: 102 {cells}/uL (ref 15–500)
Eosinophils Relative: 1.5 %
HCT: 36.7 % (ref 35.9–46.0)
Hemoglobin: 10.7 g/dL — ABNORMAL LOW (ref 11.7–15.5)
MCH: 21.1 pg — ABNORMAL LOW (ref 27.0–33.0)
MCHC: 29.2 g/dL — ABNORMAL LOW (ref 31.6–35.4)
MCV: 72.2 fL — ABNORMAL LOW (ref 81.4–101.7)
MPV: 9.6 fL (ref 7.5–12.5)
Monocytes Relative: 4.4 %
Neutro Abs: 4699 {cells}/uL (ref 1500–7800)
Neutrophils Relative %: 69.1 %
Platelets: 448 Thousand/uL — ABNORMAL HIGH (ref 140–400)
RBC: 5.08 Million/uL (ref 3.80–5.10)
RDW: 16.5 % — ABNORMAL HIGH (ref 11.0–15.0)
Total Lymphocyte: 24.6 %
WBC: 6.8 Thousand/uL (ref 3.8–10.8)

## 2024-02-29 LAB — ANTI-SCLERODERMA ANTIBODY: Scleroderma (Scl-70) (ENA) Antibody, IgG: <1 AI

## 2024-02-29 LAB — ANTI-NUCLEAR AB-TITER (ANA TITER): ANA Titer 1: 1:40 {titer} — ABNORMAL HIGH

## 2024-02-29 LAB — PROTEIN ELECTROPHORESIS, SERUM, WITH REFLEX
Albumin ELP: 4.5 g/dL (ref 3.8–4.8)
Alpha 1: 0.4 g/dL — ABNORMAL HIGH (ref 0.2–0.3)
Alpha 2: 0.8 g/dL (ref 0.5–0.9)
Beta 2: 0.4 g/dL (ref 0.2–0.5)
Beta Globulin: 0.6 g/dL (ref 0.4–0.6)
Gamma Globulin: 1.5 g/dL (ref 0.8–1.7)
Total Protein: 8.1 g/dL (ref 6.1–8.1)

## 2024-02-29 LAB — RFLX DRVVT CONFRIM: DRVVT CONFIRM: NEGATIVE

## 2024-02-29 LAB — BETA-2 GLYCOPROTEIN ANTIBODIES
Beta-2 Glyco 1 IgA: <2 U/mL (ref <20.0)
Beta-2 Glyco 1 IgM: <2 U/mL (ref <20.0)
Beta-2 Glyco I IgG: <2 U/mL (ref <20.0)

## 2024-02-29 LAB — ANTI-DNA ANTIBODY, DOUBLE-STRANDED: ds DNA Ab: 2 [IU]/mL

## 2024-02-29 LAB — C3 AND C4
C3 Complement: 174 mg/dL (ref 83–193)
C4 Complement: 21 mg/dL (ref 15–57)

## 2024-02-29 LAB — SEDIMENTATION RATE: Sed Rate: 29 mm/h (ref 0–30)

## 2024-02-29 LAB — IGG, IGA, IGM
IgG (Immunoglobin G), Serum: 1535 mg/dL (ref 600–1640)
IgM, Serum: 171 mg/dL (ref 50–300)
Immunoglobulin A: 174 mg/dL (ref 47–310)

## 2024-02-29 LAB — RNP ANTIBODY: Ribonucleic Protein(ENA) Antibody, IgG: <1 AI

## 2024-02-29 LAB — PROTEIN / CREATININE RATIO, URINE
Creatinine, Urine: 89 mg/dL (ref 20–275)
Protein/Creat Ratio: 101 mg/g{creat} (ref 24–184)
Protein/Creatinine Ratio: 0.101 mg/mg{creat} (ref 0.024–0.184)
Total Protein, Urine: 9 mg/dL (ref 5–24)

## 2024-02-29 LAB — HEPATITIS B CORE ANTIBODY, IGM: Hep B C IgM: NONREACTIVE

## 2024-02-29 LAB — ANA: Anti Nuclear Antibody (ANA): POSITIVE — AB

## 2024-02-29 LAB — SJOGRENS SYNDROME-B EXTRACTABLE NUCLEAR ANTIBODY: SSB (La) (ENA) Antibody, IgG: <1 AI

## 2024-02-29 LAB — HEPATITIS C ANTIBODY: Hepatitis C Ab: NONREACTIVE

## 2024-02-29 LAB — HEPATITIS B SURFACE ANTIGEN: Hepatitis B Surface Ag: NONREACTIVE

## 2024-02-29 LAB — THIOPURINE METHYLTRANSFERASE (TPMT), RBC: Thiopurine Methyltransferase, RBC: 21 nmol/h/mL

## 2024-02-29 LAB — ANTI-SMITH ANTIBODY: ENA SM Ab Ser-aCnc: <1 AI

## 2024-03-03 ENCOUNTER — Ambulatory Visit: Admitting: Rheumatology

## 2024-05-28 ENCOUNTER — Ambulatory Visit: Admitting: Physician Assistant
